# Patient Record
Sex: Female | Born: 1970 | Race: Black or African American | Hispanic: No | Marital: Single | Smoking: Current every day smoker
Health system: Southern US, Community
[De-identification: ages and names within clinical notes are randomized; demographics above are authoritative.]

## PROBLEM LIST (undated history)

## (undated) DIAGNOSIS — I10 Essential (primary) hypertension: Secondary | ICD-10-CM

## (undated) HISTORY — PX: FOOT SURGERY: SHX648

---

## 2011-03-26 ENCOUNTER — Emergency Department (HOSPITAL_COMMUNITY)
Admission: EM | Admit: 2011-03-26 | Discharge: 2011-03-26 | Disposition: A | Discharge status: Home / Self Care | Payer: BC Managed Care – PPO | Attending: Emergency Medicine | Admitting: Emergency Medicine

## 2011-03-26 DIAGNOSIS — M79672 Pain in left foot: Secondary | ICD-10-CM

## 2011-03-26 DIAGNOSIS — M79609 Pain in unspecified limb: Secondary | ICD-10-CM | POA: Insufficient documentation

## 2011-03-26 NOTE — ED Notes (Signed)
Lt. Foot and heel pain, began 1 week ago difficulty walking, denies any injury,

## 2011-03-26 NOTE — ED Provider Notes (Signed)
History    patient presented to the ED with chief complaints of left foot and heel pain. Patient states pain started about one week and half ago. She woke up one morning and noticed that she was having tenderness with weightbearing to the left foot. Increasing pain with foot flexion. Pain is described as throbbing and sharp and shooting. Denies numbness or weakness. Denies ankle pain or knee pain. She denies recent trauma, bleeding, or rash. Patient states she had L achilles tendon rupture at the age of 83 and required surgical intervention. She denies any specific complications until recently.  She denies changing her shoes, history of gout, history of plantar fasciitis. She is not a diabetic.  CSN: [id number] Arrival date & time: 03/26/2011  7:53 AM   First MD Initiated Contact with Patient 03/26/11 [phone]      Chief Complaint  Patient presents with  . Foot Pain    lt.  foot and heel, pt. cut her achilles tendon when she was 16, and was replaced, the pain to this area began 1 week ago. denies any injuires    (Consider location/radiation/quality/duration/timing/severity/associated sxs/prior treatment) HPI  History reviewed. No pertinent past medical history.  Past Surgical History  Procedure Date  . Foot surgery     lt. achilles tendon repair    History reviewed. No pertinent family history.  History  Substance Use Topics  . Smoking status: Former Games developer  . Smokeless tobacco: Never Used  . Alcohol Use: Yes    OB History    Grav Para Term Preterm Abortions TAB SAB Ect Mult Living                  Review of Systems  All other systems reviewed and are negative.    Allergies  Review of patient's allergies indicates no known allergies.  Home Medications  No current outpatient prescriptions on file.  BP 132/91  Pulse 78  Temp(Src) 98.4 F (36.9 C) (Oral)  Resp 16  Ht 5\' 5"  (1.651 m)  Wt 202 lb (91.627 kg)  BMI 33.61 kg/m2  SpO2 100%  LMP 03/16/2011  Physical  Exam  Constitutional: She is oriented to person, place, and time. She appears well-developed and well-nourished. No distress.  HENT:  Head: Normocephalic and atraumatic.  Eyes: Conjunctivae are normal.  Neck: Normal range of motion. Neck supple.  Cardiovascular: Normal rate and regular rhythm.  Exam reveals no gallop and no friction rub.   No murmur heard. Pulmonary/Chest: Effort normal. No respiratory distress. She has no wheezes.  Abdominal: Soft. Bowel sounds are normal. There is no tenderness.  Musculoskeletal: Normal range of motion.       Feet:  Neurological: She is alert and oriented to person, place, and time.    ED Course  Procedures (including critical care time)  Labs Reviewed - No data to display No results found.   No diagnosis found.    MDM  Patient has acute onset of left foot pain near site of prior Achilles tendon injury when she was 16.  It is questionable if this is an acute tendon injury. MRI would be the best modality, however that'll be best done outpatient after a reevaluation by an orthopedic Doctor.  No deformity noted, doubt bony pathology. No foot x-ray obtained today. A supportive boot and crutches were given. Patient is [redacted] weeks pregnant, therefore I suggest taking Tylenol only for pain. I recommend no NSAIDs, patient voiced understanding.        Fayrene Helper, PA 03/26/11  1054 

## 2011-03-26 NOTE — ED Provider Notes (Signed)
Medical screening examination/treatment/procedure(s) were performed by non-physician practitioner and as supervising physician I was immediately available for consultation/collaboration.   Kamal Jurgens A. Patrica Duel, MD 03/26/11 1131

## 2013-11-15 ENCOUNTER — Encounter (HOSPITAL_COMMUNITY): Payer: Self-pay | Admitting: Emergency Medicine

## 2013-11-15 ENCOUNTER — Emergency Department (INDEPENDENT_AMBULATORY_CARE_PROVIDER_SITE_OTHER)
Admission: EM | Admit: 2013-11-15 | Discharge: 2013-11-15 | Disposition: A | Discharge status: Home / Self Care | Payer: PRIVATE HEALTH INSURANCE | Source: Home / Self Care

## 2013-11-15 DIAGNOSIS — T148XXA Other injury of unspecified body region, initial encounter: Secondary | ICD-10-CM

## 2013-11-15 DIAGNOSIS — M7660 Achilles tendinitis, unspecified leg: Secondary | ICD-10-CM

## 2013-11-15 DIAGNOSIS — S39012A Strain of muscle, fascia and tendon of lower back, initial encounter: Secondary | ICD-10-CM

## 2013-11-15 DIAGNOSIS — S335XXA Sprain of ligaments of lumbar spine, initial encounter: Secondary | ICD-10-CM

## 2013-11-15 DIAGNOSIS — M7662 Achilles tendinitis, left leg: Secondary | ICD-10-CM

## 2013-11-15 DIAGNOSIS — X58XXXA Exposure to other specified factors, initial encounter: Secondary | ICD-10-CM

## 2013-11-15 MED ORDER — DICLOFENAC SODIUM 1 % TD GEL: 1.0000 "application " | Freq: Four times a day (QID) | TRANSDERMAL | Status: DC

## 2013-11-15 NOTE — ED Provider Notes (Signed)
Medical screening examination/treatment/procedure(s) were performed by resident physician or non-physician practitioner and as supervising physician I was immediately available for consultation/collaboration.  Erin Honig, MD     Erin J Honig, MD 11/15/13 1555 

## 2013-11-15 NOTE — ED Notes (Signed)
C/o 2 day duration of pain in low back and ankle which she attributes to working 2 double shifts 3 days ago . States she is better w OTC medications , rest , elevation of her legs. " I cant take it any more" Denies other injury

## 2013-11-15 NOTE — ED Provider Notes (Signed)
CSN: [id number]     Arrival date & time 11/15/13  1130 History   First MD Initiated Contact with Patient 11/15/13 1230     Chief Complaint  Patient presents with  . 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 She has been working double she has a long hours in the past couple weeks. Approximately 3 days ago she developed pain in the left ankle and the next day he developed pain across her lower back. Denies any known single of any injury. The pain was insidious but worsened quickly. The pain the back is exacerbated by movement such as bending or twisting of the torso. For left ankle pain is located over the left Achilles tendon. She is has a history of surgery on the tendon. The ankle bones and soft tissues are nontender and without swelling. No pain, swelling or tenderness to the foot.    History reviewed. No pertinent past medical history. Past Surgical History  Procedure Laterality Date  . Foot surgery      lt. achilles tendon repair   History reviewed. No pertinent family history. History  Substance Use Topics  . Smoking status: Former Games developermoker  . Smokeless tobacco: Never Used  . Alcohol Use: Yes   OB History   Grav Para Term Preterm Abortions TAB SAB Ect Mult Living                 Review of Systems  Constitutional: Positive for activity change. Negative for fever, chills and fatigue.  HENT: Negative.   Respiratory: Negative.   Cardiovascular: Negative.   Musculoskeletal:       As per HPI  Skin: Negative for color change, pallor and rash.  Neurological: Negative.        No focal paresthesias or weakness. No radicular pain.    Allergies  Review of patient's allergies indicates no known allergies.  Home Medications   Prior to Admission medications   Medication Sig Start Date End Date Taking? Authorizing Provider  diclofenac sodium (VOLTAREN) 1 % GEL Apply 1  application topically 4 (four) times daily. 11/15/13   Hayden Rasmussenavid Lotus Gover, NP   BP 149/104  Pulse 102  Temp(Src) 98.5 F (36.9 C)  Resp 12  SpO2 96% Physical Exam  Nursing note and vitals reviewed. Constitutional: She is oriented to person, place, and time. She appears well-developed and well-nourished. No distress.  HENT:  Head: Normocephalic and atraumatic.  Eyes: EOM are normal. Pupils are equal, round, and reactive to light.  Neck: Normal range of motion. Neck supple.  Musculoskeletal:  Tenderness across the para lumbar muscles. No spinal tenderness or deformity. No local swelling.  Neurological: She is alert and oriented to person, place, and time. No cranial nerve deficit.  Skin: Skin is warm and dry.    ED Course  Procedures (including critical care time) Labs Review Labs Reviewed - No data to display  Imaging Review No results found.   MDM   1. Low back strain, initial encounter   2. Muscle strain   3. Achilles tendinitis of left lower extremity    Diclofenac gel to back Heat to back Stretches as demo'd ASO L ankle Ice to ankle      Hayden Rasmussenavid Kamiryn Bezanson, NP 11/15/13 1251

## 2013-11-15 NOTE — Discharge Instructions (Signed)
Achilles Tendinitis Achilles tendinitis is inflammation of the tough, cord-like band that attaches the lower muscles of your leg to your heel (Achilles tendon). It is usually caused by overusing the tendon and joint involved.  CAUSES Achilles tendinitis can happen because of:  A sudden increase in exercise or activity (such as running).  Doing the same exercises or activities (such as jumping) over and over.  Not warming up calf muscles before exercising.  Exercising in shoes that are worn out or not made for exercise.  Having arthritis or a bone growth on the back of the heel bone. This can rub against the tendon and hurt the tendon. SIGNS AND SYMPTOMS The most common symptoms are:  Pain in the back of the leg, just above the heel. The pain usually gets worse with exercise and better with rest.  Stiffness or soreness in the back of the leg, especially in the morning.  Swelling of the skin over the Achilles tendon.  Trouble standing on tiptoe. Sometimes, an Achilles tendon tears (ruptures). Symptoms of an Achilles tendon rupture can include:  Sudden, severe pain in the back of the leg.  Trouble putting weight on the foot or walking normally. DIAGNOSIS Achilles tendinitis will be diagnosed based on symptoms and a physical examination. An X-ray may be done to check if another condition is causing your symptoms. An MRI may be ordered if your health care provider suspects you may have completely torn your tendon, which is called an Achilles tendon rupture.  TREATMENT  Achilles tendinitis usually gets better over time. It can take weeks to months to heal completely. Treatment focuses on treating the symptoms and helping the injury heal. HOME CARE INSTRUCTIONS   Rest your Achilles tendon and avoid activities that cause pain.  Apply ice to the injured area:  Put ice in a plastic bag.  Place a towel between your skin and the bag.  Leave the ice on for 20 minutes, 2-3 times a  day  Try to avoid using the tendon (other than gentle range of motion) while the tendon is painful. Do not resume use until instructed by your health care provider. Then begin use gradually. Do not increase use to the point of pain. If pain does develop, decrease use and continue the above measures. Gradually increase activities that do not cause discomfort until you achieve normal use.  Do exercises to make your calf muscles stronger and more flexible. Your health care provider or physical therapist can recommend exercises for you to do.  Wrap your ankle with an elastic bandage or other wrap. This can help keep your tendon from moving too much. Your health care provider will show you how to wrap your ankle correctly.  Only take over-the-counter or prescription medicines for pain, discomfort, or fever as directed by your health care provider. SEEK MEDICAL CARE IF:   Your pain and swelling increase or pain is uncontrolled with medicines.  You develop new, unexplained symptoms or your symptoms get worse.  You are unable to move your toes or foot.  You develop warmth and swelling in your foot.  You have an unexplained temperature. MAKE SURE YOU:   Understand these instructions.  Will watch your condition.  Will get help right away if you are not doing well or get worse. Document Released: 01/23/2005 Document Revised: 02/03/2013 Document Reviewed: 11/25/2012 [facility] Patient Information 2015 [place], [place]. This information is not intended to replace advice given to you by your health care provider. Make sure you discuss  any questions you have with your health care provider.  Back Pain, Adult Low back pain is very common. About 1 in 5 people have back pain.The cause of low back pain is rarely dangerous. The pain often gets better over time.About half of people with a sudden onset of back pain feel better in just 2 weeks. About 8 in 10 people feel better by 6 weeks.  CAUSES Some common  causes of back pain include:  Strain of the muscles or ligaments supporting the spine.  Wear and tear (degeneration) of the spinal discs.  Arthritis.  Direct injury to the back. DIAGNOSIS Most of the time, the direct cause of low back pain is not known.However, back pain can be treated effectively even when the exact cause of the pain is unknown.Answering your caregiver's questions about your overall health and symptoms is one of the most accurate ways to make sure the cause of your pain is not dangerous. If your caregiver needs more information, he or she may order lab work or imaging tests (X-rays or MRIs).However, even if imaging tests show changes in your back, this usually does not require surgery. HOME CARE INSTRUCTIONS For many people, back pain returns.Since low back pain is rarely dangerous, it is often a condition that people can learn to [facility] their own.   Remain active. It is stressful on the back to sit or stand in one place. Do not sit, drive, or stand in one place for more than 30 minutes at a time. Take short walks on level surfaces as soon as pain allows.Try to increase the length of time you walk each day.  Do not stay in bed.Resting more than 1 or 2 days can delay your recovery.  Do not avoid exercise or work.Your body is made to move.It is not dangerous to be active, even though your back may hurt.Your back will likely heal faster if you return to being active before your pain is gone.  Pay attention to your body when you bend and lift. Many people have less discomfortwhen lifting if they bend their knees, keep the load close to their bodies,and avoid twisting. Often, the most comfortable positions are those that put less stress on your recovering back.  Find a comfortable position to sleep. Use a firm mattress and lie on your side with your knees slightly bent. If you lie on your back, put a pillow under your knees.  Only take over-the-counter or  prescription medicines as directed by your caregiver. Over-the-counter medicines to reduce pain and inflammation are often the most helpful.Your caregiver may prescribe muscle relaxant drugs.These medicines help dull your pain so you can more quickly return to your normal activities and healthy exercise.  Put ice on the injured area.  Put ice in a plastic bag.  Place a towel between your skin and the bag.  Leave the ice on for 15-20 minutes, 03-04 times a day for the first 2 to 3 days. After that, ice and heat may be alternated to reduce pain and spasms.  Ask your caregiver about trying back exercises and gentle massage. This may be of some benefit.  Avoid feeling anxious or stressed.Stress increases muscle tension and can worsen back pain.It is important to recognize when you are anxious or stressed and learn ways to manage it.Exercise is a great option. SEEK MEDICAL CARE IF:  You have pain that is not relieved with rest or medicine.  You have pain that does not improve in 1 week.  You  have new symptoms.  You are generally not feeling well. SEEK IMMEDIATE MEDICAL CARE IF:   You have pain that radiates from your back into your legs.  You develop new bowel or bladder control problems.  You have unusual weakness or numbness in your arms or legs.  You develop nausea or vomiting.  You develop abdominal pain.  You feel faint. Document Released: 04/15/2005 Document Revised: 10/15/2011 Document Reviewed: 09/03/2010 [facility] Patient Information 2015 [place], [place]. This information is not intended to replace advice given to you by your health care provider. Make sure you discuss any questions you have with your health care provider.  Lumbosacral Strain Lumbosacral strain is a strain of any of the parts that make up your lumbosacral vertebrae. Your lumbosacral vertebrae are the bones that make up the lower third of your backbone. Your lumbosacral vertebrae are held together by  muscles and tough, fibrous tissue (ligaments).  CAUSES  A sudden blow to your back can cause lumbosacral strain. Also, anything that causes an excessive stretch of the muscles in the low back can cause this strain. This is typically seen when people exert themselves strenuously, fall, lift heavy objects, bend, or crouch repeatedly. RISK FACTORS  Physically demanding work.  Participation in pushing or pulling sports or sports that require a sudden twist of the back (tennis, golf, baseball).  Weight lifting.  Excessive lower back curvature.  Forward-tilted pelvis.  Weak back or abdominal muscles or both.  Tight hamstrings. SIGNS AND SYMPTOMS  Lumbosacral strain may cause pain in the area of your injury or pain that moves (radiates) down your leg.  DIAGNOSIS Your health care provider can often diagnose lumbosacral strain through a physical exam. In some cases, you may need tests such as X-ray exams.  TREATMENT  Treatment for your lower back injury depends on many factors that your clinician will have to evaluate. However, most treatment will include the use of anti-inflammatory medicines. HOME CARE INSTRUCTIONS   Avoid hard physical activities (tennis, racquetball, waterskiing) if you are not in proper physical condition for it. This may aggravate or create problems.  If you have a back problem, avoid sports requiring sudden body movements. Swimming and walking are generally safer activities.  Maintain good posture.  Maintain a healthy weight.  For acute conditions, you may put ice on the injured area.  Put ice in a plastic bag.  Place a towel between your skin and the bag.  Leave the ice on for 20 minutes, 2-3 times a day.  When the low back starts healing, stretching and strengthening exercises may be recommended. SEEK MEDICAL CARE IF:  Your back pain is getting worse.  You experience severe back pain not relieved with medicines. SEEK IMMEDIATE MEDICAL CARE IF:   You  have numbness, tingling, weakness, or problems with the use of your arms or legs.  There is a change in bowel or bladder control.  You have increasing pain in any area of the body, including your belly (abdomen).  You notice shortness of breath, dizziness, or feel faint.  You feel sick to your stomach (nauseous), are throwing up (vomiting), or become sweaty.  You notice discoloration of your toes or legs, or your feet get very cold. MAKE SURE YOU:   Understand these instructions.  Will watch your condition.  Will get help right away if you are not doing well or get worse. Document Released: 01/23/2005 Document Revised: 04/20/2013 Document Reviewed: 12/02/2012 [facility] Patient Information 2015 [place], [place]. This information is not intended  to replace advice given to you by your health care provider. Make sure you discuss any questions you have with your health care provider.  Muscle Strain A muscle strain (pulled muscle) happens when a muscle is stretched beyond normal length. It happens when a sudden, violent force stretches your muscle too far. Usually, a few of the fibers in your muscle are torn. Muscle strain is common in athletes. Recovery usually takes 1-2 weeks. Complete healing takes 5-6 weeks.  HOME CARE   Follow the PRICE method of treatment to help your injury get better. Do this the first 2-3 days after the injury:  Protect. Protect the muscle to keep it from getting injured again.  Rest. Limit your activity and rest the injured body part.  Ice. Put ice in a plastic bag. Place a towel between your skin and the bag. Then, apply the ice and leave it on from 15-20 minutes each hour. After the third day, switch to moist heat packs.  Compression. Use a splint or elastic bandage on the injured area for comfort. Do not put it on too tightly.  Elevate. Keep the injured body part above the level of your heart.  Only take medicine as told by your doctor.  Warm up before  doing exercise to prevent future muscle strains. GET HELP IF:   You have more pain or puffiness (swelling) in the injured area.  You feel numbness, tingling, or notice a loss of strength in the injured area. MAKE SURE YOU:   Understand these instructions.  Will watch your condition.  Will get help right away if you are not doing well or get worse. Document Released: 01/23/2008 Document Revised: 02/03/2013 Document Reviewed: 11/12/2012 [facility] Patient Information 2015 [place]. This information is not intended to replace advice given to you by your health care provider. Make sure you discuss any questions you have with your health care provider.

## 2015-03-03 ENCOUNTER — Emergency Department (HOSPITAL_COMMUNITY)
Admission: EM | Admit: 2015-03-03 | Discharge: 2015-03-03 | Disposition: A | Discharge status: Home / Self Care | Payer: Self-pay | Attending: Emergency Medicine | Admitting: Emergency Medicine

## 2015-03-03 ENCOUNTER — Emergency Department (HOSPITAL_COMMUNITY): Payer: Self-pay

## 2015-03-03 ENCOUNTER — Encounter (HOSPITAL_COMMUNITY): Payer: Self-pay

## 2015-03-03 DIAGNOSIS — Z720 Tobacco use: Secondary | ICD-10-CM | POA: Insufficient documentation

## 2015-03-03 DIAGNOSIS — I10 Essential (primary) hypertension: Secondary | ICD-10-CM | POA: Insufficient documentation

## 2015-03-03 DIAGNOSIS — R112 Nausea with vomiting, unspecified: Secondary | ICD-10-CM | POA: Insufficient documentation

## 2015-03-03 DIAGNOSIS — R05 Cough: Secondary | ICD-10-CM | POA: Insufficient documentation

## 2015-03-03 DIAGNOSIS — R197 Diarrhea, unspecified: Secondary | ICD-10-CM | POA: Insufficient documentation

## 2015-03-03 DIAGNOSIS — Z7982 Long term (current) use of aspirin: Secondary | ICD-10-CM | POA: Insufficient documentation

## 2015-03-03 DIAGNOSIS — R52 Pain, unspecified: Secondary | ICD-10-CM | POA: Insufficient documentation

## 2015-03-03 DIAGNOSIS — R059 Cough, unspecified: Secondary | ICD-10-CM

## 2015-03-03 HISTORY — DX: Essential (primary) hypertension: I10

## 2015-03-03 LAB — I-STAT CHEM 8, ED
BUN: 4 mg/dL — AB (ref 6–20)
CREATININE: 0.8 mg/dL (ref 0.44–1.00)
Calcium, Ion: 1.09 mmol/L — ABNORMAL LOW (ref 1.12–1.23)
Chloride: 101 mmol/L (ref 101–111)
Glucose, Bld: 102 mg/dL — ABNORMAL HIGH (ref 65–99)
HEMATOCRIT: 45 % (ref 36.0–46.0)
Hemoglobin: 15.3 g/dL — ABNORMAL HIGH (ref 12.0–15.0)
POTASSIUM: 3.6 mmol/L (ref 3.5–5.1)
Sodium: 137 mmol/L (ref 135–145)
TCO2: 20 mmol/L (ref 0–100)

## 2015-03-03 LAB — I-STAT TROPONIN, ED: Troponin i, poc: 0 ng/mL (ref 0.00–0.08)

## 2015-03-03 MED ORDER — ACETAMINOPHEN 325 MG PO TABS
650.0000 mg | ORAL_TABLET | Freq: Once | ORAL | Status: AC
  Administered 2015-03-03: 650 mg via ORAL
  Filled 2015-03-03: qty 2

## 2015-03-03 MED ORDER — IBUPROFEN 400 MG PO TABS
600.0000 mg | ORAL_TABLET | Freq: Once | ORAL | Status: AC
  Administered 2015-03-03: 600 mg via ORAL
  Filled 2015-03-03 (×2): qty 1

## 2015-03-03 NOTE — ED Provider Notes (Signed)
CSN: [id number]     Arrival date & time 03/03/15  0940 History   First MD Initiated Contact with Patient 03/03/15 [phone]     Chief Complaint  Patient presents with  . Chest Pain     (Consider location/radiation/quality/duration/timing/severity/associated sxs/prior Treatment) HPI Comments: 44 year old female with no significant medical history, cigarettes smoker presents with cough, vomiting, body aches and generally feeling unwell for the past few days. No recent travel. No significant sick contacts however patient does work in a nursing home. Patient has mild bilateral chest discomfort only with vomiting and coughing. No cardiac history. No recent surgeries  Patient is a 44 y.o. female presenting with chest pain. The history is provided by the patient.  Chest Pain Associated symptoms: headache, nausea and vomiting   Associated symptoms: no abdominal pain, no back pain, no fever and no shortness of breath     Past Medical History  Diagnosis Date  . Hypertension    Past Surgical History  Procedure Laterality Date  . Foot surgery      lt. achilles tendon repair   No family history on file. Social History  Substance Use Topics  . Smoking status: Current Every Day Smoker  . Smokeless tobacco: Never Used  . Alcohol Use: Yes   OB History    No data available     Review of Systems  Constitutional: Positive for chills and appetite change. Negative for fever.  HENT: Negative for congestion.   Eyes: Negative for visual disturbance.  Respiratory: Negative for shortness of breath.   Cardiovascular: Positive for chest pain.  Gastrointestinal: Positive for nausea, vomiting and diarrhea. Negative for abdominal pain.  Genitourinary: Negative for dysuria and flank pain.  Musculoskeletal: Negative for back pain, neck pain and neck stiffness.  Skin: Negative for rash.  Neurological: Positive for headaches. Negative for light-headedness.      Allergies  Review of patient's allergies  indicates no known allergies.  Home Medications   Prior to Admission medications   Medication Sig Start Date End Date Taking? Authorizing Provider  acetaminophen (TYLENOL) 325 MG tablet Take 650 mg by mouth every 6 (six) hours as needed for mild pain.   Yes Historical Provider, MD  Aspirin-Acetaminophen-Caffeine (GOODYS EXTRA STRENGTH) 520-260-32.5 MG PACK Take 1 packet by mouth daily as needed. For pain   Yes Historical Provider, MD  guaiFENesin (MUCINEX) 600 MG 12 hr tablet Take 600 mg by mouth 2 (two) times daily as needed for to loosen phlegm.   Yes Historical Provider, MD  diclofenac sodium (VOLTAREN) 1 % GEL Apply 1 application topically 4 (four) times daily. Patient not taking: Reported on 03/03/2015 11/15/13   Hayden Rasmussen, NP   BP 134/92 mmHg  Pulse 103  Temp(Src) 98.7 F (37.1 C) (Oral)  Resp 28  Ht  (1.651 m)  Wt 190 lb (86.183 kg)  BMI 31.62 kg/m2  SpO2 97%  LMP 02/07/2015 Physical Exam  Constitutional: She is oriented to person, place, and time. She appears well-developed and well-nourished.  HENT:  Head: Normocephalic and atraumatic.  Eyes: Conjunctivae are normal. Right eye exhibits no discharge. Left eye exhibits no discharge.  Neck: Normal range of motion. Neck supple. No tracheal deviation present.  Cardiovascular: Regular rhythm.   Pulmonary/Chest: Effort normal and breath sounds normal.  Abdominal: Soft. She exhibits no distension. There is no tenderness. There is no guarding.  Musculoskeletal: She exhibits no edema.  Neurological: She is alert and oriented to person, place, and time.  Skin: Skin is warm. No rash  noted.  Psychiatric: She has a normal mood and affect.  Nursing note and vitals reviewed.   ED Course  Procedures (including critical care time) Labs Review Labs Reviewed  I-STAT CHEM 8, ED - Abnormal; Notable for the following:    BUN 4 (*)    Glucose, Bld 102 (*)    Calcium, Ion 1.09 (*)    Hemoglobin 15.3 (*)    All other components  within normal limits  I-STAT TROPOININ, ED    Imaging Review Dg Chest 2 View  03/03/2015  CLINICAL DATA:  Cough, congestion and low-grade fever. EXAM: CHEST - 2 VIEW COMPARISON:  None FINDINGS: The heart size and mediastinal contours are within normal limits. There is no evidence of pulmonary edema, consolidation, pneumothorax, nodule or pleural fluid. The visualized skeletal structures are unremarkable. IMPRESSION: No active disease. Electronically Signed   By: Irish LackGlenn  Yamagata M.D.   On: 03/03/2015 10:35   I have personally reviewed and evaluated these images and lab results as part of my medical decision-making.   EKG Interpretation None     EKG reviewed heart rate 103, sinus, normal QT, no acute ST changes. MDM   Final diagnoses:  Cough  Body aches   Patient presents with viral-like symptoms. Chest x-ray reviewed no acute findings. Patient nontoxic appearing, tolerating oral and the ER. Patient requesting work note. Follow-up and supportive care discussed.  Results and differential diagnosis were discussed with the patient/parent/guardian. Xrays were independently reviewed by myself.  Close follow up outpatient was discussed, comfortable with the plan.   Medications  acetaminophen (TYLENOL) tablet 650 mg (650 mg Oral Given 03/03/15 1018)  ibuprofen (ADVIL,MOTRIN) tablet 600 mg (600 mg Oral Given 03/03/15 1018)    Filed Vitals:   03/03/15 0959 03/03/15 1015 03/03/15 1052  BP: 134/92 129/89 107/80  Pulse: 103 102 95  Temp: 98.7 F (37.1 C)    TempSrc: Oral    Resp: 28 24   Height: 5\' 5"  (1.651 m)    Weight: 190 lb (86.183 kg)    SpO2: 97% 99% 99%    Final diagnoses:  Cough  Body aches        Blane OharaJoshua Aileena Iglesia, MD 03/03/15 1056

## 2015-03-03 NOTE — Discharge Instructions (Signed)
If you were given medicines take as directed.  If you are on coumadin or contraceptives realize their levels and effectiveness is altered by many different medicines.  If you have any reaction (rash, tongues swelling, other) to the medicines stop taking and see a physician.    If your blood pressure was elevated in the ER make sure you follow up for management with a primary doctor or return for chest pain, shortness of breath or stroke symptoms.  Please follow up as directed and return to the ER or see a physician for new or worsening symptoms.  Thank you. Filed Vitals:   03/03/15 0959  BP: 134/92  Pulse: 103  Temp: 98.7 F (37.1 C)  TempSrc: Oral  Resp: 28  Height: 5\' 5"  (1.651 m)  Weight: 190 lb (86.183 kg)  SpO2: 97%

## 2015-03-03 NOTE — ED Notes (Signed)
Pt. Developed cough and congestion with chest pain.  Body aches n/v/d . Pt. Works at [employer] and feels she may have developed the symptoms from there

## 2015-06-07 ENCOUNTER — Emergency Department (HOSPITAL_COMMUNITY)
Admission: EM | Admit: 2015-06-07 | Discharge: 2015-06-07 | Disposition: A | Discharge status: Home / Self Care | Payer: Self-pay | Attending: Emergency Medicine | Admitting: Emergency Medicine

## 2015-06-07 ENCOUNTER — Encounter (HOSPITAL_COMMUNITY): Payer: Self-pay | Admitting: Family Medicine

## 2015-06-07 DIAGNOSIS — I10 Essential (primary) hypertension: Secondary | ICD-10-CM | POA: Insufficient documentation

## 2015-06-07 DIAGNOSIS — F17200 Nicotine dependence, unspecified, uncomplicated: Secondary | ICD-10-CM | POA: Insufficient documentation

## 2015-06-07 NOTE — ED Provider Notes (Signed)
CSN: [id number]     Arrival date & time 06/07/15  0803 History   First MD Initiated Contact with Patient 06/07/15 [phone]     Chief Complaint  Patient presents with  . Hypertension     (Consider location/radiation/quality/duration/timing/severity/associated sxs/prior Treatment) HPI  Blood pressure 140/104, pulse 84, temperature 98.3 F (36.8 C), temperature source Oral, resp. rate 18, SpO2 100 %.  Debra Wallace is a 45 y.o. female  with a history of hypertension presents for evaluation of an episode of dizziness and high blood pressure. 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 She thinks she "smoked her cigarette too fast" because when she went inside she felt dizzy and had to sit down and drink a glass of water. Pt also did not eat anything for breakfast this AM. The nurse at her work became concerned and checked her blood pressure at that time and it was 142/102, so she came here to the ED for further evaluation. Currently denies any complaint of dizziness/lightheadedness, vision changes, headache, chest pain, shortness of breath, cough, fevers. She was diagnosed with hypertension 15-20 years ago and was previously on valsartan. Due to issues with insurance, pt was unable to continue taking this and has not had a PCP in years, though she states her insurance with her new job starts next week on 06/14/15.  Past Medical History  Diagnosis Date  . Hypertension    Past Surgical History  Procedure Laterality Date  . Foot surgery      lt. achilles tendon repair   History reviewed. No pertinent family history. Social History  Substance Use Topics  . Smoking status: Current Every Day Smoker  . Smokeless tobacco: Never Used  . Alcohol Use: Yes   OB History    No data available     Review of Systems  10 systems reviewed and found to be negative, except as noted in the HPI.   Allergies  Review of patient's allergies indicates no known allergies.  Home Medications    Prior to Admission medications   Medication Sig Start Date End Date Taking? Authorizing Provider  diclofenac sodium (VOLTAREN) 1 % GEL Apply 1 application topically 4 (four) times daily. Patient not taking: Reported on 03/03/2015 11/15/13   Hayden Rasmussen, NP   BP 135/105 mmHg  Pulse 94  Temp(Src) 98.1 F (36.7 C) (Oral)  Resp 20  SpO2 98% Physical Exam  Constitutional: She is oriented to person, place, and time. She appears well-developed and well-nourished. No distress.  HENT:  Head: Normocephalic and atraumatic.  Mouth/Throat: Oropharynx is clear and moist.  Eyes: Conjunctivae and EOM are normal. Pupils are equal, round, and reactive to light.  Neck: Normal range of motion.  Cardiovascular: Normal rate, regular rhythm and intact distal pulses.   Pulmonary/Chest: Effort normal and breath sounds normal.  Abdominal: Soft. There is no tenderness.  Musculoskeletal: Normal range of motion. She exhibits no edema or tenderness.  Neurological: She is alert and oriented to person, place, and time.  Follows commands, Clear, goal oriented speech, Strength is 5 out of 5x4 extremities, patient ambulates with a coordinated in nonantalgic gait. Sensation is grossly intact.   Skin: She is not diaphoretic.  Psychiatric: She has a normal mood and affect.  Nursing note and vitals reviewed.   ED Course  Procedures (including critical care time) Labs Review Labs Reviewed - No data to display  Imaging Review No results found. I have personally reviewed and evaluated these images and lab results  as part of my medical decision-making.   EKG Interpretation   Date/Time:  Wednesday June 07 2015 09:50:30 EST Ventricular Rate:  86 PR Interval:  166 QRS Duration: 97 QT Interval:  380 QTC Calculation: 454 R Axis:   67 Text Interpretation:  Sinus rhythm RSR' in V1 or V2, right VCD or RVH  Confirmed by Lincoln Brigham [phone]) on 06/07/2015 10:27:06 AM      MDM   Final diagnoses:  Hypertension,  uncontrolled  Tobacco use disorder    Filed Vitals:   06/07/15 0805 06/07/15 0830 06/07/15 0915 06/07/15 0952  BP: 164/113 135/105 134/102 140/104  Pulse: 113 94 93 84  Temp: 98.1 F (36.7 C)   98.3 F (36.8 C)  TempSrc: Oral   Oral  Resp: SpO2: 100% 98% 97% 100%     Debra Wallace is 45 y.o. female presenting for evaluation of elevated blood pressure and episode of lightheadedness, EKG with no acute changes. Patient is asymptomatic at this point. Luckily, this patient's insurance comes into effect next week. Patient is given outpatient referral guide so she can establish primary care and they can manage her chronic hypertension. I've counseled this patient on smoking cessation for right within 10 minutes.  Evaluation does not show pathology that would require ongoing emergent intervention or inpatient treatment. Pt is hemodynamically stable and mentating appropriately. Discussed findings and plan with patient/guardian, who agrees with care plan. All questions answered. Return precautions discussed and outpatient follow up given.       Wynetta Emery, PA-C 06/07/15 1044  Tilden Fossa, MD 06/08/15 [phone]

## 2015-06-07 NOTE — ED Notes (Signed)
Pt here for evaluation of hypertension. sts she was at work and stood up to fast and felt a little dizzy. sts she feels fine now. No complaints.

## 2015-06-07 NOTE — Discharge Instructions (Signed)
Please follow with your primary care doctor in the next 5 days for high blood pressure evaluation. If you do not have a primary care doctor, present to urgent care. Reduce salt intake. Seek emergency medical care for unilateral weakness, slurring, change in vision, or chest pain and shortness of breath.  Do not hesitate to return to the emergency room for any new, worsening or concerning symptoms.  Please obtain primary care using resource guide below. Let them know that you were seen in the emergency room and that they will need to obtain records for further outpatient management.  Hypertension Hypertension is another name for high blood pressure. High blood pressure forces your heart to work harder to pump blood. A blood pressure reading has two numbers, which includes a higher number over a lower number (example: 110/72). HOME CARE   Have your blood pressure rechecked by your doctor.  Only take medicine as told by your doctor. Follow the directions carefully. The medicine does not work as well if you skip doses. Skipping doses also puts you at risk for problems.  Do not smoke.  Monitor your blood pressure at home as told by your doctor. GET HELP IF:  You think you are having a reaction to the medicine you are taking.  You have repeat headaches or feel dizzy.  You have puffiness (swelling) in your ankles.  You have trouble with your vision. GET HELP RIGHT AWAY IF:   You get a very bad headache and are confused.  You feel weak, numb, or faint.  You get chest or belly (abdominal) pain.  You throw up (vomit).  You cannot breathe very well. MAKE SURE YOU:   Understand these instructions.  Will watch your condition.  Will get help right away if you are not doing well or get worse.   This information is not intended to replace advice given to you by your health care provider. Make sure you discuss any questions you have with your health care provider.   Document Released:  10/02/2007 Document Revised: 04/20/2013 Document Reviewed: 02/05/2013 Elsevier Interactive Patient Education 2016 [employer].    Emergency Department Resource Guide 1) Find a Doctor and Pay Out of Pocket Although you won't have to find out who is covered by your insurance plan, it is a good idea to ask around and get recommendations. You will then need to call the office and see if the doctor you have chosen will accept you as a new patient and what types of options they offer for patients who are self-pay. Some doctors offer discounts or will set up payment plans for their patients who do not have insurance, but you will need to ask so you aren't surprised when you get to your appointment.  2) Contact Your Local Health Department Not all health departments have doctors that can see patients for sick visits, but many do, so it is worth a call to see if yours does. If you don't know where your local health department is, you can check in your phone book. The CDC also has a tool to help you locate your state's health department, and many state websites also have listings of all of their local health departments.  3) Find a Walk-in Clinic If your illness is not likely to be very severe or complicated, you may want to try a walk in clinic. These are popping up all over the country in pharmacies, drugstores, and shopping centers. They're usually staffed by nurse practitioners or physician assistants  that have been trained to treat common illnesses and complaints. They're usually fairly quick and inexpensive. However, if you have serious medical issues or chronic medical problems, these are probably not your best option.  No Primary Care Doctor: - Call Health Connect at  [phone] - they can help you locate a primary care doctor that  accepts your insurance, provides certain services, etc. - Physician Referral Service- [phone]  Chronic Pain Problems: Organization         Address  Phone    Notes  [facility]  [phone] Patients need to be referred by their primary care doctor.   Medication Assistance: Organization         Address  Phone   Notes  [facility] [address] [place] [place], [place] [address] [phone] --Must be a resident of [facility] -- Must have NO insurance coverage whatsoever (no Medicaid/ Medicare, etc.) -- The pt. MUST have a primary care doctor that directs their care regularly and follows them in the community   [facility]  [phone]   [employer]  [phone]    Agencies that provide inexpensive medical care: Organization         Address  Phone   Notes  [facility]  [phone]   [facility]    [phone]   [facility] [address] [place], [place] [address] [phone]   [facility] [address] [place] [place] [phone]   Planned Parenthood    [phone]   [facility]    [phone]   [facility] and [facility]  [address], [place] Phone:  [phone], Fax:  [phone] Hours of Operation:  9 am - 6 pm, M-F.  Also accepts Medicaid/Medicare and self-pay.  [facility]  [address], [place] Phone: [phone], Fax: [phone]. Hours of Operation:  8:30 am - 5:30 pm, M-F.  Also accepts Medicaid and self-pay.  [facility] [place] [address] [place] Phone: [phone]   [facility] [address] Natasha Bence [place], [place] [phone], Ext. [phone] Mondays & Thursdays: 7-9 AM.  First 15 patients are seen on a first come, first serve basis.    Medicaid-accepting [facility] Providers:  Organization         Address  Phone   Notes  [facility] [address], [place] [phone] Also accepts self-pay patients.  [facility]  [address] Laurell Josephs [place], [place]  [phone]   [facility] [address], [place] [phone]   [facility] [address] [place] [phone]   Renaye Rakers [address], [place]   [phone] Only accepts [place] Access [place] patients after they have their name applied to their card.   Self-Pay (no insurance) in [facility]:  Organization         Address  Phone   Notes  Sickle Cell Patients, [facility] [address] [place], [place] [phone]   [facility] [address] [place], [place] [phone]   [facility] [place]  [address], [place] [phone]   [facility]/Dr. Osei-Bonsu  [address] [place] [address],  [facility] or [address], [place] [phone] Phone number for both [place] and [place] locations is the same.  Urgent Medical and [facility] [address] [place] [phone]   [facility] [address], [place] or [address] [phone]   [facility] [address] [place] [phone], phone; [phone], fax Sees patients 1st and 3rd Saturday of every month.  Must not qualify for public or private insurance (i.e. Medicaid, Medicare, [place] Health Choice, Veterans' Benefits)  Household income should be no more than 200% of the poverty level The clinic cannot treat you if you are pregnant or think you are pregnant  Sexually transmitted diseases are not treated at the clinic.    Dental Care: Organization         Address  Phone  Notes  [facility] Department of [facility] [address] [place], [place] [phone] Accepts children up to age 37 who are enrolled in [place] or [place] Health Choice; pregnant women with a Medicaid card; and children who have  applied for Medicaid or [place] Health Choice, but were declined, whose parents can pay a reduced fee at time of service.  [facility] Department of [facility]  [address], [place] [phone] Accepts children up to age 25 who are enrolled in [place] or [place] Health Choice; pregnant women with a Medicaid card; and children who have applied for Medicaid or [place] Health Choice, but were declined, whose parents can pay a reduced fee at time of service.  [facility]  [address] [place], [place] [phone] Patients are seen by appointment only. Walk-ins are not accepted. [facility] will see patients 70 years of age and older. Monday - Tuesday (8am-5pm) Most Wednesdays (8:30-5pm) $30 per visit, cash only  [facility] Adult Dental Access PROGRAM  [address], [facility] [phone] Patients are seen by appointment only. Walk-ins are not accepted. [facility] will see patients 15 years of age and older. One Wednesday Evening (Monthly: Volunteer Based).  $30 per visit, cash only  [employer]  [phone] for adults; Children under age 33, call Graduate Pediatric Dentistry at [phone]. Children aged 42-14, please call [phone] to request a pediatric application.  Dental services are provided in all areas of dental care including fillings, crowns and bridges, complete and partial dentures, implants, gum treatment, root canals, and extractions. Preventive care is also provided. Treatment is provided to both adults and children. Patients are selected via a lottery and there is often a waiting list.   [facility] [address] [place]  [phone] [email or web address]   [facility] [address] [place], [place] [phone], Ext. [phone] Second and Fourth Thursday of each month, opens at 6:30 AM; Clinic ends at 9 AM.  Patients are seen on a first-come first-served basis, and a  limited number are seen during each clinic.   [facility]  [address] Ether Griffins [place], [place] [phone]   Eligibility Requirements You must have lived in [place], [place], or [place] counties for at least the last three months.   You cannot be eligible for state or federal sponsored [place], including [employer], [place], or [employer].   You generally cannot be eligible for healthcare insurance through your employer.    How to apply:  Eligibility screenings are held every Tuesday and Wednesday afternoon from 1:00 pm until 4:00 pm. You do not need an appointment for the interview!  [facility] [address] [place], [place]   [place]  Homeland Department  [place]  [phone]    Behavioral Health Resources in the Community: Intensive Outpatient Programs Organization         Address  Phone  Notes  [facility] [place]. [address], [place], [place] [phone]   [facility] [address] [place], [place]   ADS: [facility] [address] [place], [place]   [place] [address]  [place], [place] or [phone]   Substance Abuse Resources Organization         Address  Phone  Notes  Al[facility] Drug Services  [phone]   [place]  [phone]   [facility] [place]   Chinita Pester  [phone]   Residential & Outpatient Substance Abuse Program  [phone]   Psychological Services Organization         Address  Phone  Notes  [facility] [place]  [place]  [phone]   [place] [address], [place] or [phone]    Mobile Crisis Teams Organization          Address  Phone  Notes  [facility], Mobile Crisis Care Unit  [phone]   [facility]  [address] [place], [place]   [place] Levels [address] [place] [phone]    Self-Help/Support Groups Organization         Address  Phone             Notes  [place]. of [place] - variety of support groups  [place] Call for more information  Narcotics Anonymous (NA), [facility] [address], [employer]  2 meetings at this location   Special educational needs teacher         Address  Phone  Notes  [facility] Residential Treatment [place],    [place]  [phone]   [facility]  [address] [place] [phone], [place], [place]   [place], [place] [phone] Admissions: 8am-3pm M-F  Incentives Substance [place] [address]    [place], [place] [phone]   [facility] [address] [place], [place], [place]   [facility] [address],  [place], [place]   [facility] - Intensive Outpatient [address], [place], [place]   [facility].,  [place], [place] [phone] or [phone]   [facility] (RTS) [address] [place], [place] Accepts Medicaid  [facility] [address]  [place] [phone] Substance Abuse/Addiction Treatment   [facility] Organization         Address  Phone  Notes  [facility]  [phone]   Domenic Schwab, PhD [address] Arlis Porta [place], [place]   [phone] or [phone]   [facility] [place], [place] [phone]   [place] [address] [place], [place] [phone] Insurance/Medicaid/sponsorship  through [facility] and Families [address], [address] [place], [place] [phone] [place], [place] [phone]    Dr. Adele Schilder  [phone]   [facility] Dept. 1) [address], [place] 2) [place] 3)  [place] [address], [place] [phone]  [phone]   [place] [phone] or [phone] (After Hours)

## 2015-12-08 IMAGING — DX DG CHEST 2V
2 series · 2 of 2 positions shown · non-contrast
Comparison: None

CLINICAL DATA: Cough, congestion and low-grade fever.

EXAM:
CHEST - 2 VIEW

[chest pa]
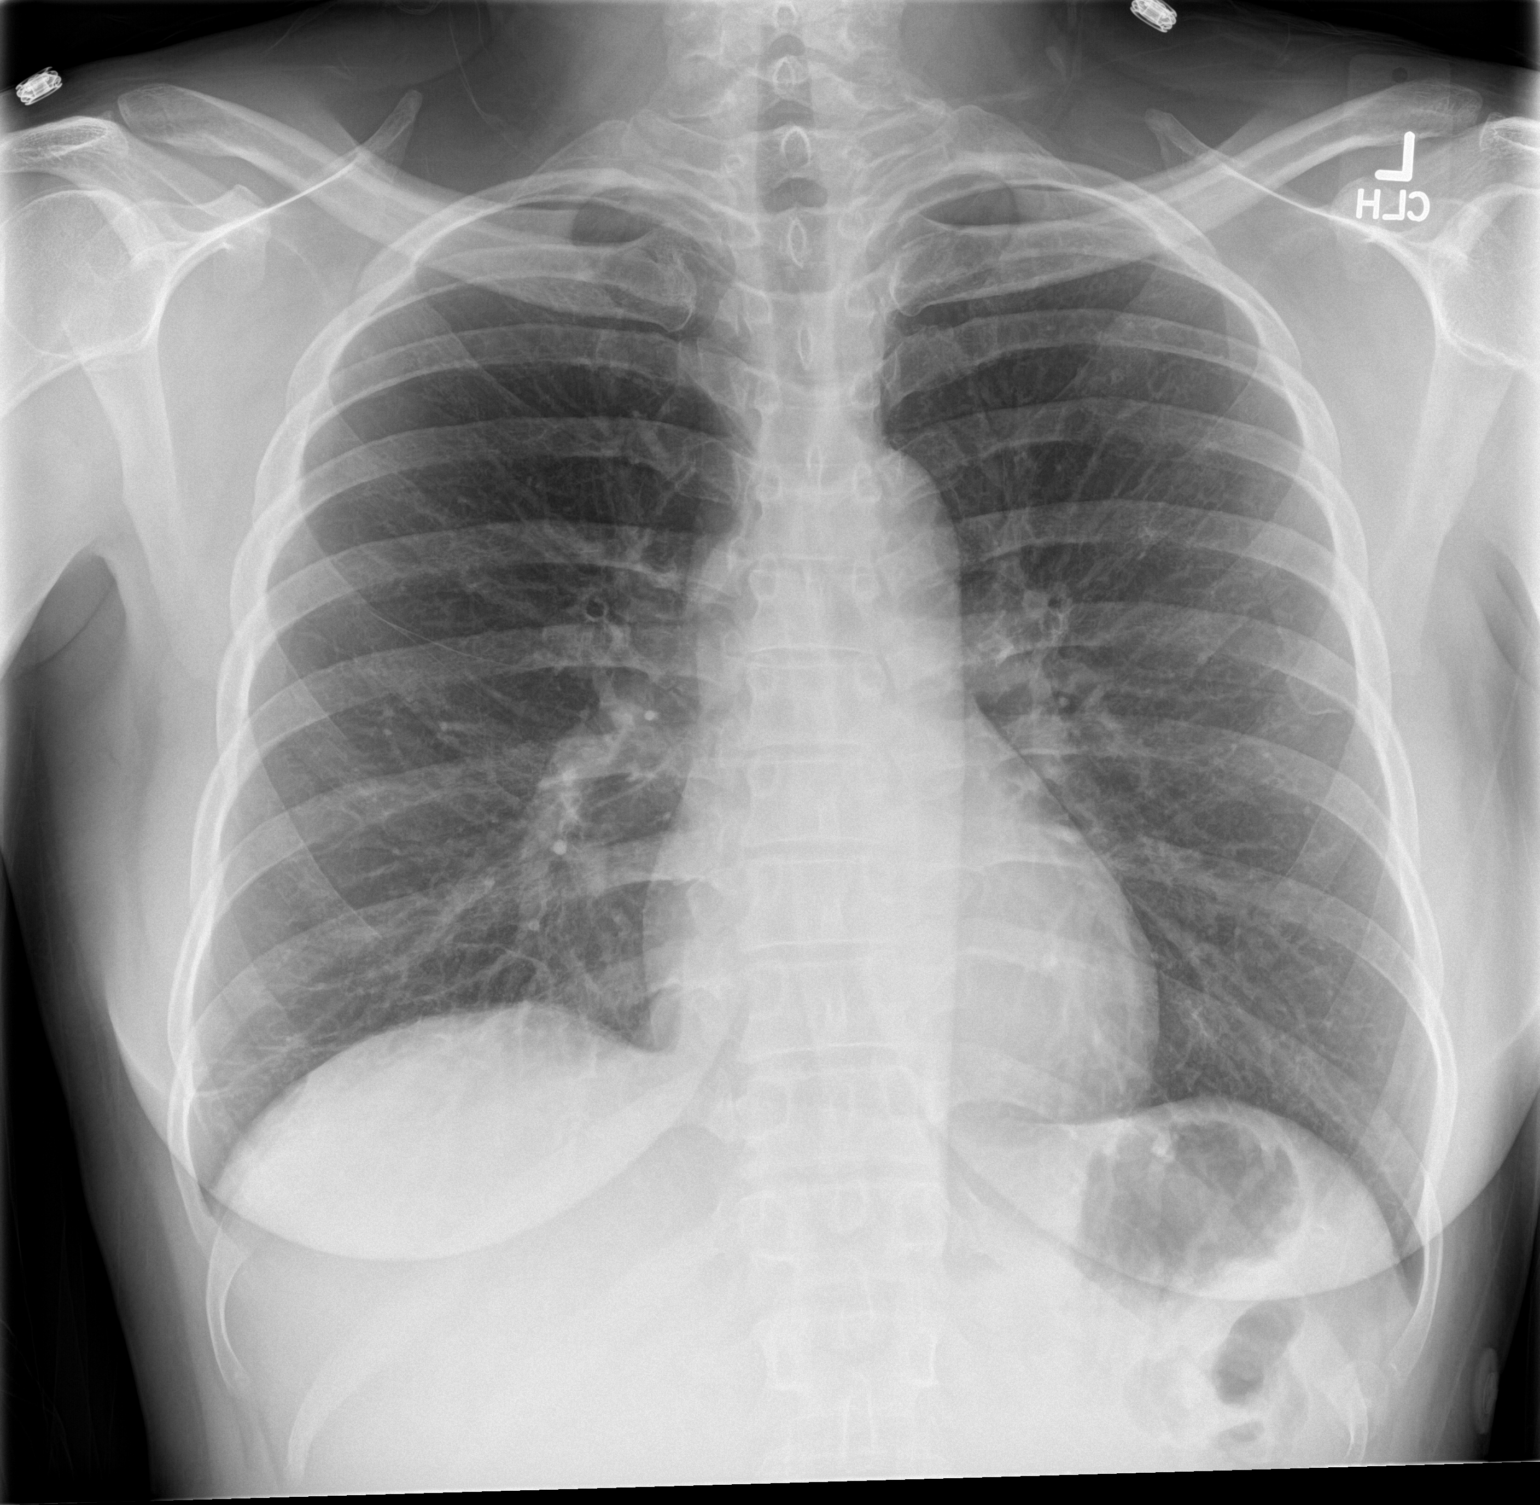

[chest lat]
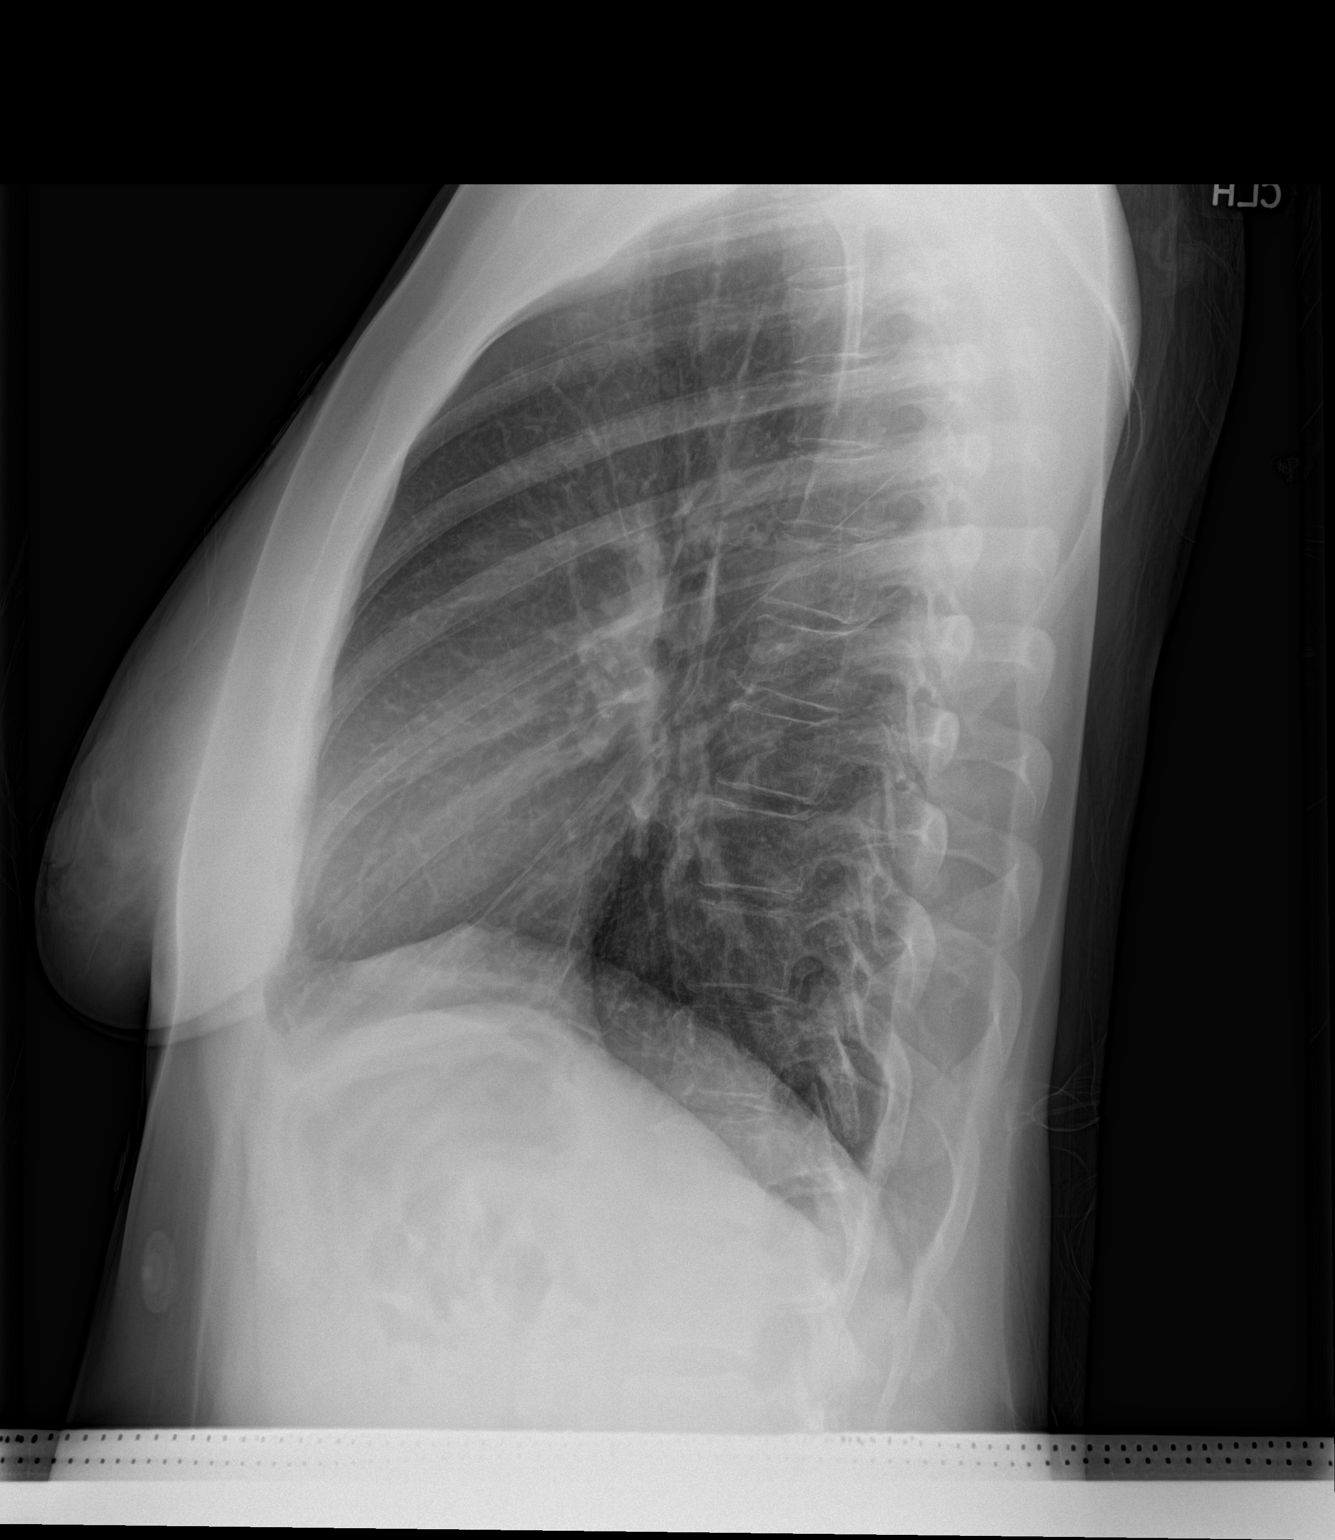

[2 of 2 positions shown; findings below may reference images not displayed]

FINDINGS: The heart size and mediastinal contours are within normal limits.
There is no evidence of pulmonary edema, consolidation,
pneumothorax, nodule or pleural fluid. The visualized skeletal
structures are unremarkable.
IMPRESSION: No active disease.

## 2019-09-22 ENCOUNTER — Ambulatory Visit (INDEPENDENT_AMBULATORY_CARE_PROVIDER_SITE_OTHER): Payer: Medicaid Other | Admitting: Obstetrics

## 2019-09-22 ENCOUNTER — Other Ambulatory Visit (HOSPITAL_COMMUNITY)
Admission: RE | Admit: 2019-09-22 | Discharge: 2019-09-22 | Disposition: A | Discharge status: Home / Self Care | Payer: Medicaid Other | Source: Ambulatory Visit | Attending: Obstetrics | Admitting: Obstetrics

## 2019-09-22 ENCOUNTER — Other Ambulatory Visit: Payer: Self-pay

## 2019-09-22 ENCOUNTER — Encounter: Payer: Self-pay | Admitting: Obstetrics

## 2019-09-22 VITALS — BP 133/96 | HR 98 | Ht 65.0 in | Wt 114.7 lb

## 2019-09-22 DIAGNOSIS — N898 Other specified noninflammatory disorders of vagina: Secondary | ICD-10-CM | POA: Diagnosis present

## 2019-09-22 DIAGNOSIS — N83201 Unspecified ovarian cyst, right side: Secondary | ICD-10-CM

## 2019-09-22 DIAGNOSIS — Z780 Asymptomatic menopausal state: Secondary | ICD-10-CM

## 2019-09-22 DIAGNOSIS — Z01419 Encounter for gynecological examination (general) (routine) without abnormal findings: Secondary | ICD-10-CM | POA: Insufficient documentation

## 2019-09-22 DIAGNOSIS — Z113 Encounter for screening for infections with a predominantly sexual mode of transmission: Secondary | ICD-10-CM

## 2019-09-22 DIAGNOSIS — Z0000 Encounter for general adult medical examination without abnormal findings: Secondary | ICD-10-CM

## 2019-09-22 DIAGNOSIS — Z1272 Encounter for screening for malignant neoplasm of vagina: Secondary | ICD-10-CM

## 2019-09-22 DIAGNOSIS — R634 Abnormal weight loss: Secondary | ICD-10-CM

## 2019-09-22 DIAGNOSIS — Z1239 Encounter for other screening for malignant neoplasm of breast: Secondary | ICD-10-CM

## 2019-09-22 DIAGNOSIS — F17200 Nicotine dependence, unspecified, uncomplicated: Secondary | ICD-10-CM

## 2019-09-22 NOTE — Progress Notes (Signed)
Pt is here as a new patient to establish care. Last pap, unknown. Postmenopausal. Last MMG, unknown. PHQ-9 score: 11.

## 2019-09-22 NOTE — Progress Notes (Signed)
Subjective:        Debra Wallace is a 49 y.o. female here for a routine exam.  Current complaints: No period for 2 years.  Recent weight loss of ~ 70 lbs that she feels is due to stress because she loss her job and has been homeless.    Personal health questionnaire:  Is patient Debra Wallace, have a family history of breast and/or ovarian cancer: no Is there a family history of uterine cancer diagnosed at age < 76, gastrointestinal cancer, urinary tract cancer, family member who is a Field seismologist syndrome-associated carrier: no Is the patient overweight and hypertensive, family history of diabetes, personal history of gestational diabetes, preeclampsia or PCOS: no Is patient over 61, have PCOS,  family history of premature CHD under age 54, diabetes, smoke, have hypertension or peripheral artery disease:  no At any time, has a partner hit, kicked or otherwise hurt or frightened you?: no Over the past 2 weeks, have you felt down, depressed or hopeless?: no Over the past 2 weeks, have you felt little interest or pleasure in doing things?:no   Gynecologic History Patient's last menstrual period was 02/07/2015. Contraception: post menopausal status Last Pap: unknown. Results were: unknown Last mammogram: unknown. Results were: unknown  Obstetric History OB History  Gravida Para Term Preterm AB Living  2 2 2     2   SAB TAB Ectopic Multiple Live Births          2    # Outcome Date GA Lbr Len/2nd Weight Sex Delivery Anes PTL Lv  2 Term      Vag-Spont   LIV  1 Term      Vag-Spont   LIV    Past Medical History:  Diagnosis Date  . Hypertension     Past Surgical History:  Procedure Laterality Date  . FOOT SURGERY     lt. achilles tendon repair     Current Outpatient Medications:  .  diclofenac sodium (VOLTAREN) 1 % GEL, Apply 1 application topically 4 (four) times daily. (Patient not taking: Reported on 03/03/2015), Disp: 100 g, Rfl: 0 No Known Allergies  Social History   Tobacco  Use  . Smoking status: Current Every Day Smoker    Packs/day: 1.00  . Smokeless tobacco: Never Used  Substance Use Topics  . Alcohol use: Yes    Family History  Problem Relation Age of Onset  . Hypertension Mother   . Diabetes Father       Review of Systems  Constitutional: negative for fatigue and weight loss Respiratory: negative for cough and wheezing Cardiovascular: negative for chest pain, fatigue and palpitations Gastrointestinal: negative for abdominal pain and change in bowel habits Musculoskeletal:negative for myalgias Neurological: negative for gait problems and tremors Behavioral/Psych: negative for abusive relationship, depression Endocrine: negative for temperature intolerance    Genitourinary:negative for abnormal menstrual periods, genital lesions, hot flashes, sexual problems and vaginal discharge Integument/breast: negative for breast lump, breast tenderness, nipple discharge and skin lesion(s)    Objective:       BP (!) 133/96   Pulse 98   Ht 5\' 5"  (1.651 m)   Wt 114 lb 11.2 oz (52 kg)   LMP 02/07/2015   BMI 19.09 kg/m  General:   alert  Skin:   no rash or abnormalities  Lungs:   clear to auscultation bilaterally  Heart:   regular rate and rhythm, S1, S2 normal, no murmur, click, rub or gallop  Breasts:   normal without suspicious masses, skin  or nipple changes or axillary nodes  Abdomen:  normal findings: no organomegaly, soft, non-tender and no hernia  Pelvis:  External genitalia: normal general appearance Urinary system: urethral meatus normal and bladder without fullness, nontender Vaginal: normal without tenderness, induration or masses Cervix: normal appearance Adnexa: normal bimanual exam Uterus: anteverted and non-tender, normal size   Lab Review Urine pregnancy test Labs reviewed yes Radiologic studies reviewed no  50% of 20 min visit spent on counseling and coordination of care.   Assessment:     1. Encounter for routine  gynecological examination with Papanicolaou smear of cervix Rx: - Cytology - PAP( Debra Wallace) - Comprehensive metabolic panel - TSH - Hemoglobin A1c - CBC with Differential/Platelet - Ferritin  2. Postmenopausal for 2 years  3. Vaginal discharge Rx: - Cervicovaginal ancillary only( Debra Wallace)  4. Screen for STD (sexually transmitted disease) Rx: - Hepatitis B surface antigen - Hepatitis C antibody - HIV Antibody (routine testing w rflx) - RPR  5. Cyst of right ovary Rx: - US PELVIC COMPLETE WITH TRANSVAGINAL; Future  6. Recent unexplained weight loss  7. Screening breast examination Rx: - MM Digital Screening; Future  8. Tobacco dependence - cessation with medication and behavioral modification recommended  9. Routine adult health maintenance Rx: - Ambulatory referral to Internal Medicine    Plan:    Education reviewed: calcium supplements, depression evaluation, low fat, low cholesterol diet, safe sex/STD prevention, self breast exams, smoking cessation and weight bearing exercise. Mammogram ordered. Follow up in: 2 weeks.   No orders of the defined types were placed in this encounter.  Orders Placed This Encounter  Procedures  . MM Digital Screening    Standing Status:   Future    Standing Expiration Date:   09/21/2020    Order Specific Question:   Reason for Exam (SYMPTOM  OR DIAGNOSIS REQUIRED)    Answer:   Screening.    Order Specific Question:   Is the patient pregnant?    Answer:   No    Order Specific Question:   Preferred imaging location?    Answer:   Debra Wallace  . US PELVIC COMPLETE WITH TRANSVAGINAL    Standing Status:   Future    Standing Expiration Date:   09/21/2020    Order Specific Question:   Reason for Exam (SYMPTOM  OR DIAGNOSIS REQUIRED)    Answer:   Ovarian cyst    Order Specific Question:   Preferred imaging location?    Answer:   Debra Wallace  . Hepatitis B surface antigen  . Hepatitis C antibody  . HIV Antibody  (routine testing w rflx)  . RPR  . Comprehensive metabolic panel  . TSH  . Hemoglobin A1c  . CBC with Differential/Platelet  . Ferritin  . Ambulatory referral to Internal Medicine    Referral Priority:   Routine    Referral Type:   Consultation    Referral Reason:   Specialty Services Required    Requested Specialty:   Internal Medicine    Number of Visits Requested:   1    Brock Bad, MD 09/22/2019 2:48 PM

## 2019-09-23 ENCOUNTER — Other Ambulatory Visit: Payer: Self-pay | Admitting: Obstetrics

## 2019-09-23 DIAGNOSIS — N760 Acute vaginitis: Secondary | ICD-10-CM

## 2019-09-23 LAB — CERVICOVAGINAL ANCILLARY ONLY
Bacterial Vaginitis (gardnerella): POSITIVE — AB
Candida Glabrata: NEGATIVE
Candida Vaginitis: NEGATIVE
Chlamydia: NEGATIVE
Comment: NEGATIVE
Comment: NEGATIVE
Comment: NEGATIVE
Comment: NEGATIVE
Comment: NEGATIVE
Comment: NORMAL
Neisseria Gonorrhea: NEGATIVE
Trichomonas: NEGATIVE

## 2019-09-23 MED ORDER — TINIDAZOLE 500 MG PO TABS: 1000.0000 mg | ORAL_TABLET | Freq: Every day | ORAL | 2 refills | Status: DC

## 2019-09-24 ENCOUNTER — Telehealth: Payer: Self-pay | Admitting: *Deleted

## 2019-09-24 ENCOUNTER — Telehealth (HOSPITAL_COMMUNITY): Payer: Self-pay

## 2019-09-24 ENCOUNTER — Other Ambulatory Visit: Payer: Self-pay | Admitting: Obstetrics

## 2019-09-24 ENCOUNTER — Telehealth: Payer: Self-pay | Admitting: Obstetrics

## 2019-09-24 DIAGNOSIS — B20 Human immunodeficiency virus [HIV] disease: Secondary | ICD-10-CM

## 2019-09-24 LAB — CBC WITH DIFFERENTIAL/PLATELET
Basophils Absolute: 0 10*3/uL (ref 0.0–0.2)
Basos: 1 %
EOS (ABSOLUTE): 0 10*3/uL (ref 0.0–0.4)
Eos: 1 %
Hematocrit: 37.6 % (ref 34.0–46.6)
Hemoglobin: 13.1 g/dL (ref 11.1–15.9)
Immature Grans (Abs): 0.1 10*3/uL (ref 0.0–0.1)
Immature Granulocytes: 3 %
Lymphocytes Absolute: 1.3 10*3/uL (ref 0.7–3.1)
Lymphs: 38 %
MCH: 32.4 pg (ref 26.6–33.0)
MCHC: 34.8 g/dL (ref 31.5–35.7)
MCV: 93 fL (ref 79–97)
Monocytes Absolute: 0.2 10*3/uL (ref 0.1–0.9)
Monocytes: 5 %
Neutrophils Absolute: 1.8 10*3/uL (ref 1.4–7.0)
Neutrophils: 52 %
Platelets: 166 10*3/uL (ref 150–450)
RBC: 4.04 x10E6/uL (ref 3.77–5.28)
RDW: 13.1 % (ref 11.7–15.4)
WBC: 3.4 10*3/uL (ref 3.4–10.8)

## 2019-09-24 LAB — COMPREHENSIVE METABOLIC PANEL
ALT: 97 IU/L — ABNORMAL HIGH (ref 0–32)
AST: 248 IU/L — ABNORMAL HIGH (ref 0–40)
Albumin/Globulin Ratio: 0.5 — ABNORMAL LOW (ref 1.2–2.2)
Albumin: 3 g/dL — ABNORMAL LOW (ref 3.8–4.8)
Alkaline Phosphatase: 279 IU/L — ABNORMAL HIGH (ref 48–121)
BUN/Creatinine Ratio: 6 — ABNORMAL LOW (ref 9–23)
BUN: 3 mg/dL — ABNORMAL LOW (ref 6–24)
Bilirubin Total: 0.3 mg/dL (ref 0.0–1.2)
CO2: 18 mmol/L — ABNORMAL LOW (ref 20–29)
Calcium: 8.2 mg/dL — ABNORMAL LOW (ref 8.7–10.2)
Chloride: 102 mmol/L (ref 96–106)
Creatinine, Ser: 0.48 mg/dL — ABNORMAL LOW (ref 0.57–1.00)
GFR calc Af Amer: 134 mL/min/{1.73_m2} (ref >59)
GFR calc non Af Amer: 116 mL/min/{1.73_m2} (ref >59)
Globulin, Total: 5.8 g/dL — ABNORMAL HIGH (ref 1.5–4.5)
Glucose: 89 mg/dL (ref 65–99)
Potassium: 3.8 mmol/L (ref 3.5–5.2)
Sodium: 138 mmol/L (ref 134–144)
Total Protein: 8.8 g/dL — ABNORMAL HIGH (ref 6.0–8.5)

## 2019-09-24 LAB — HEPATITIS C ANTIBODY: Hep C Virus Ab: 0.1 s/co ratio (ref 0.0–0.9)

## 2019-09-24 LAB — RPR: RPR Ser Ql: NONREACTIVE

## 2019-09-24 LAB — HIV ANTIBODY (ROUTINE TESTING W REFLEX): HIV Screen 4th Generation wRfx: REACTIVE — AB

## 2019-09-24 LAB — TSH: TSH: 3.71 u[IU]/mL (ref 0.450–4.500)

## 2019-09-24 LAB — HIV 1/2 AB DIFFERENTIATION
HIV 1 Ab: POSITIVE — AB
HIV 2 Ab: NEGATIVE
NOTE (HIV CONF MULTIP: POSITIVE — AB

## 2019-09-24 LAB — HEPATITIS B SURFACE ANTIGEN: Hepatitis B Surface Ag: NEGATIVE

## 2019-09-24 LAB — HEMOGLOBIN A1C
Est. average glucose Bld gHb Est-mCnc: 108 mg/dL
Hgb A1c MFr Bld: 5.4 % (ref 4.8–5.6)

## 2019-09-24 LAB — FERRITIN: Ferritin: 922 ng/mL — ABNORMAL HIGH (ref 15–150)

## 2019-09-24 NOTE — Telephone Encounter (Signed)
Called patient and informed her of positive HIV testing, and that she would be referred to Infectious Disease Clinic at [facility] for further evaluation and treatment.  All questions answered to her satisfaction.

## 2019-09-24 NOTE — Telephone Encounter (Signed)
-----   Message from Blanchard Kelch, NP sent at 09/24/2019  8:34 AM EDT ----- New diagnosis from what I can see with +HIV-1 Ab at recent encounter to establish care for GYN.

## 2019-09-24 NOTE — Telephone Encounter (Signed)
Sending referral to [facility]. Andree Coss, RN

## 2019-09-24 NOTE — Telephone Encounter (Signed)
Spoke with [facility] regarding pt HIV lab results. States that the no longer reflex to Western Blot testing, they now run HIV 1/2 Differentiation testing.  Pt was Positive for HIV-1.  [facility] states this is used for there definitive testing.  There is also an RNA test that could be ran if provider chooses.    Please place call to pt and discuss results.

## 2019-09-24 NOTE — Telephone Encounter (Signed)
Yes needs to come into care and get on ARV. DIs then?

## 2019-09-28 ENCOUNTER — Telehealth: Payer: Self-pay

## 2019-09-28 ENCOUNTER — Telehealth: Payer: Self-pay | Admitting: *Deleted

## 2019-09-28 LAB — CYTOLOGY - PAP
Comment: NEGATIVE
High risk HPV: NEGATIVE

## 2019-09-28 MED ORDER — METRONIDAZOLE 500 MG PO TABS: 500.0000 mg | ORAL_TABLET | Freq: Two times a day (BID) | ORAL | 0 refills | Status: DC

## 2019-09-28 NOTE — Telephone Encounter (Signed)
Debra Wallace, can you contact the patient to schedule new B20 labs, financial, MD, and pharmacy appointments please? Thank you!

## 2019-09-28 NOTE — Telephone Encounter (Signed)
-----   Message from Randall Hiss, MD sent at 09/25/2019  1:27 PM EDT ----- Looks like Gyn did make aware so we should get her int o care ASAP ----- Message ----- From: Blanchard Kelch, NP Sent: 09/24/2019   9:37 AM EDT To: Randall Hiss, MD  Not that I can see - looks like she was just seen to establish care at GYN office here locally and it was done as part of routine STI screening. I would hope it was discussed at the clinic visit.   ----- Message ----- From: Daiva Eves, Lisette Grinder, MD Sent: 09/24/2019   9:29 AM EDT To: Blanchard Kelch, NP, Veryl Speak, FNP, #  Is he aware of diagnosis if so lets bring her in ----- Message ----- From: Blanchard Kelch, NP Sent: 09/24/2019   8:34 AM EDT To: Randall Hiss, MD, Veryl Speak, FNP, #  New diagnosis from what I can see with +HIV-1 Ab at recent encounter to establish care for GYN.

## 2019-09-28 NOTE — Telephone Encounter (Signed)
Called pt to advise that rx was changed from tinidazole to metronidazole due to insurance, no answer, left vm to call.

## 2019-10-08 ENCOUNTER — Other Ambulatory Visit: Payer: Self-pay

## 2019-10-08 DIAGNOSIS — Z113 Encounter for screening for infections with a predominantly sexual mode of transmission: Secondary | ICD-10-CM

## 2019-10-08 DIAGNOSIS — Z79899 Other long term (current) drug therapy: Secondary | ICD-10-CM

## 2019-10-08 DIAGNOSIS — B20 Human immunodeficiency virus [HIV] disease: Secondary | ICD-10-CM

## 2019-10-11 ENCOUNTER — Other Ambulatory Visit: Payer: Self-pay

## 2019-10-11 ENCOUNTER — Encounter: Payer: Self-pay | Admitting: Student

## 2019-10-11 ENCOUNTER — Ambulatory Visit: Payer: Self-pay

## 2019-10-11 DIAGNOSIS — B20 Human immunodeficiency virus [HIV] disease: Secondary | ICD-10-CM

## 2019-10-12 LAB — T-HELPER CELL (CD4) - (RCID CLINIC ONLY)
CD4 % Helper T Cell: 14 % — ABNORMAL LOW (ref 33–65)
CD4 T Cell Abs: 136 /uL — ABNORMAL LOW (ref 400–1790)

## 2019-10-13 LAB — CBC WITH DIFFERENTIAL/PLATELET
Absolute Monocytes: 189 cells/uL — ABNORMAL LOW (ref 200–950)
Basophils Absolute: 21 cells/uL (ref 0–200)
Basophils Relative: 0.7 %
Eosinophils Absolute: 9 cells/uL — ABNORMAL LOW (ref 15–500)
Eosinophils Relative: 0.3 %
HCT: 33.9 % — ABNORMAL LOW (ref 35.0–45.0)
Hemoglobin: 11.5 g/dL — ABNORMAL LOW (ref 11.7–15.5)
Lymphs Abs: 1104 cells/uL (ref 850–3900)
MCH: 31.9 pg (ref 27.0–33.0)
MCHC: 33.9 g/dL (ref 32.0–36.0)
MCV: 93.9 fL (ref 80.0–100.0)
MPV: 9.5 fL (ref 7.5–12.5)
Monocytes Relative: 6.3 %
Neutro Abs: 1677 cells/uL (ref 1500–7800)
Neutrophils Relative %: 55.9 %
Platelets: 214 10*3/uL (ref 140–400)
RBC: 3.61 10*6/uL — ABNORMAL LOW (ref 3.80–5.10)
RDW: 13 % (ref 11.0–15.0)
Total Lymphocyte: 36.8 %
WBC: 3 10*3/uL — ABNORMAL LOW (ref 3.8–10.8)

## 2019-10-13 LAB — COMPREHENSIVE METABOLIC PANEL
AG Ratio: 0.6 (calc) — ABNORMAL LOW (ref 1.0–2.5)
ALT: 37 U/L — ABNORMAL HIGH (ref 6–29)
AST: 44 U/L — ABNORMAL HIGH (ref 10–35)
Albumin: 2.9 g/dL — ABNORMAL LOW (ref 3.6–5.1)
Alkaline phosphatase (APISO): 177 U/L — ABNORMAL HIGH (ref 31–125)
BUN/Creatinine Ratio: 11 (calc) (ref 6–22)
BUN: 5 mg/dL — ABNORMAL LOW (ref 7–25)
CO2: 25 mmol/L (ref 20–32)
Calcium: 8.4 mg/dL — ABNORMAL LOW (ref 8.6–10.2)
Chloride: 103 mmol/L (ref 98–110)
Creat: 0.46 mg/dL — ABNORMAL LOW (ref 0.50–1.10)
Globulin: 4.9 g/dL (calc) — ABNORMAL HIGH (ref 1.9–3.7)
Glucose, Bld: 85 mg/dL (ref 65–99)
Potassium: 3.9 mmol/L (ref 3.5–5.3)
Sodium: 136 mmol/L (ref 135–146)
Total Bilirubin: 0.5 mg/dL (ref 0.2–1.2)
Total Protein: 7.8 g/dL (ref 6.1–8.1)

## 2019-10-13 LAB — HIV-1 RNA QUANT-NO REFLEX-BLD
HIV 1 RNA Quant: 140000 copies/mL — ABNORMAL HIGH
HIV-1 RNA Quant, Log: 5.15 Log copies/mL — ABNORMAL HIGH

## 2019-10-18 ENCOUNTER — Encounter: Payer: Self-pay | Admitting: Obstetrics

## 2019-10-18 ENCOUNTER — Other Ambulatory Visit (HOSPITAL_COMMUNITY)
Admission: RE | Admit: 2019-10-18 | Discharge: 2019-10-18 | Disposition: A | Discharge status: Home / Self Care | Payer: Medicaid Other | Source: Ambulatory Visit | Attending: Obstetrics | Admitting: Obstetrics

## 2019-10-18 ENCOUNTER — Other Ambulatory Visit: Payer: Self-pay

## 2019-10-18 ENCOUNTER — Ambulatory Visit (INDEPENDENT_AMBULATORY_CARE_PROVIDER_SITE_OTHER): Payer: Medicaid Other | Admitting: Obstetrics

## 2019-10-18 VITALS — BP 145/93 | HR 107 | Ht 65.0 in | Wt 117.0 lb

## 2019-10-18 DIAGNOSIS — R87612 Low grade squamous intraepithelial lesion on cytologic smear of cervix (LGSIL): Secondary | ICD-10-CM

## 2019-10-18 LAB — POCT URINE PREGNANCY: Preg Test, Ur: NEGATIVE

## 2019-10-18 NOTE — Progress Notes (Signed)
Pt is in the office for colpo procedure. 

## 2019-10-18 NOTE — Progress Notes (Signed)
Colposcopy Procedure Note  Indications: Pap smear 1 months ago showed: low-grade squamous intraepithelial neoplasia (LGSIL - encompassing HPV,mild dysplasia,CIN I). The prior pap showed abnormalities, but not report available.  Prior cervical/vaginal disease: unknown. Prior cervical treatment: no treatment.  Procedure Details  The risks and benefits of the procedure and Written informed consent obtained.  A time-out was performed confirming the patient, procedure and allergy status  Speculum placed in vagina and excellent visualization of cervix achieved, cervix swabbed x 3 with acetic acid solution.  Findings: Cervix: no visible lesions, no mosaicism, no punctation and no abnormal vasculature; SCJ visualized 360 degrees without lesions, endocervical curettage performed, cervical biopsies taken at 6 and 12 o'clock, specimen labelled and sent to pathology and hemostasis achieved with silver nitrate.   Vaginal inspection: normal without visible lesions. Vulvar colposcopy: vulvar colposcopy not performed.   Physical Exam   Specimens: ECC and Cervical Biopsies  Complications: none.  Plan: Specimens labelled and sent to Pathology. Will base further treatment on Pathology findings. Treatment options discussed with patient. Post biopsy instructions given to patient. Return to discuss Pathology results in 2 weeks   Brock Bad, MD 10/18/2019 2:37 PM.

## 2019-10-20 LAB — SURGICAL PATHOLOGY

## 2019-10-26 ENCOUNTER — Encounter: Payer: Medicaid Other | Admitting: Infectious Diseases

## 2019-10-26 ENCOUNTER — Ambulatory Visit: Payer: Medicaid Other | Admitting: Pharmacist

## 2019-10-26 ENCOUNTER — Telehealth: Payer: Self-pay | Admitting: Pharmacy Technician

## 2019-10-26 NOTE — Telephone Encounter (Signed)
[facility] Patient Product/process development scientist completed.    The patient is uninsured (has [place] Medicaid family planning that will not cover needed medication) and will need patient assistance for medication.  We can complete the application and will need to meet with the patient for signatures and income documentation.  Netty Starring. Dimas Aguas CPhT Specialty Pharmacy Patient [facility] Phone: [phone] Fax:  [phone]

## 2019-10-27 ENCOUNTER — Ambulatory Visit: Payer: Medicaid Other

## 2019-11-04 ENCOUNTER — Ambulatory Visit: Payer: Medicaid Other

## 2019-11-23 ENCOUNTER — Encounter: Payer: Self-pay | Admitting: Infectious Diseases

## 2019-11-23 ENCOUNTER — Other Ambulatory Visit: Payer: Self-pay

## 2019-11-23 ENCOUNTER — Ambulatory Visit: Payer: Medicaid Other | Admitting: Pharmacist

## 2019-11-23 ENCOUNTER — Ambulatory Visit (INDEPENDENT_AMBULATORY_CARE_PROVIDER_SITE_OTHER): Payer: Self-pay | Admitting: Infectious Diseases

## 2019-11-23 VITALS — BP 142/101 | HR 92 | Temp 98.9°F | Wt 119.0 lb

## 2019-11-23 DIAGNOSIS — Z21 Asymptomatic human immunodeficiency virus [HIV] infection status: Secondary | ICD-10-CM | POA: Insufficient documentation

## 2019-11-23 DIAGNOSIS — Z0000 Encounter for general adult medical examination without abnormal findings: Secondary | ICD-10-CM

## 2019-11-23 DIAGNOSIS — R87619 Unspecified abnormal cytological findings in specimens from cervix uteri: Secondary | ICD-10-CM

## 2019-11-23 DIAGNOSIS — R634 Abnormal weight loss: Secondary | ICD-10-CM

## 2019-11-23 DIAGNOSIS — R87612 Low grade squamous intraepithelial lesion on cytologic smear of cervix (LGSIL): Secondary | ICD-10-CM

## 2019-11-23 DIAGNOSIS — B20 Human immunodeficiency virus [HIV] disease: Secondary | ICD-10-CM

## 2019-11-23 HISTORY — DX: Human immunodeficiency virus (HIV) disease: B20

## 2019-11-23 MED ORDER — BIKTARVY 50-200-25 MG PO TABS
1.0000 | ORAL_TABLET | Freq: Every day | ORAL | 5 refills | Status: DC
Start: 2019-11-23 — End: 2020-01-24

## 2019-11-23 MED ORDER — SULFAMETHOXAZOLE-TRIMETHOPRIM 400-80 MG PO TABS
1.0000 | ORAL_TABLET | Freq: Every day | ORAL | 5 refills | Status: DC
Start: 2019-11-23 — End: 2020-01-24

## 2019-11-23 NOTE — Assessment & Plan Note (Signed)
New patient here to establish for HIV care. Treatment naive with wildtype virus. Ready to start medications. CD4 nadir 146 today - no findings of OI on exam - will start prophylactic Bactrim.   I discussed with Waynard Reeds treatment options/side effects, benefits of treatment and long-term outcomes. I discussed how HIV is transmitted and the process of untreated HIV including increased risk for opportunistic infections, cancer, dementia and renal failure. Patient was counseled on routine HIV care including medication adherence, blood monitoring, necessary vaccines and follow up visits. Counseled regarding safe sex practices including: condom use, partner disclosure, limiting partners. Discussed successful practices of ART and understands to reach out to our clinic in the future with questions.   Will start BIKTARVY for HIV treatment.   General introduction to our clinic and integrated services. Introduced to [place] and [facility] today. Dental referral placed today for [facility]. Information to schedule appointment completed today.   I spent greater than 45 minutes with the patient today. Greater than 50% of the time spent face-to-face counseling and coordination of care re: HIV and health maintenance.

## 2019-11-23 NOTE — Progress Notes (Signed)
Name: Debra Wallace  DOB: June 23, 1970 MRN: [id number] PCP: Patient, No Pcp Per    Patient Active Problem List   Diagnosis Date Noted   Weight loss 11/29/2019   Abnormal Pap smear of cervix 11/29/2019   Healthcare maintenance 11/29/2019   HIV (human immunodeficiency virus infection) ([place]) 11/23/2019     Brief Narrative:  Debra Wallace is a 49 y.o. female with HIV disease and AIDS, Dx at routine GYN care visit 08-2019 CD4 nadir 136 HIV Risk: heterosexual  History of OIs: none Intake Labs 08-2019: Hep B sAg (-), sAb (), cAb (); Hep A (), Hep C (-) Quantiferon () HLA B*5701 () G6PD: ()   Previous Regimens:  Naive   Genotypes:  08-2019: Wildtype   Subjective:   Chief Complaint  Patient presents with   New Patient (Initial Visit)    B20     HPI: Debra Wallace is here today for her first visit for treatment of newly diagnosed HIV disease and AIDS (CD4 nadir 136). She was tested after recommended by new GYN team she started seeing for general health maintenance. She has noticed some weight loss and low energy. Used to be an athlete in school but weight / muscle mass has been dropping off slowly for a few months now. Otherwise reports no complaints today suggestive of associated opportunistic infection or advancing HIV disease such as fevers, night sweats, anorexia, cough, SOB, nausea, vomiting, diarrhea, headache, sensory changes, lymphadenopathy or oral thrush.    She is not currently sexually active but prefers female partners. No previous HIV+ partners to her knowledge and has not had a high number of partners. No history of drug use. Drinks alcohol regularly and has been trying to decrease her intake recently after liver function tests have   Feels like she is eating and sleeping well. No concern at this time for depressed or anxious mood.   Pap smear was done recently revealing LSIL on cytology. High Risk HPV negative.    Review of Systems  Constitutional: Positive for weight  loss. Negative for chills, fever and malaise/fatigue.  HENT: Negative for sore throat.   Respiratory: Negative for cough, sputum production and shortness of breath.   Cardiovascular: Negative.   Gastrointestinal: Negative for abdominal pain, diarrhea and vomiting.  Musculoskeletal: Negative for joint pain, myalgias and neck pain.  Skin: Negative for rash.  Neurological: Negative for headaches.  Psychiatric/Behavioral: Negative for depression and substance abuse. The patient is not nervous/anxious.     Past Medical History:  Diagnosis Date   HIV (human immunodeficiency virus infection) ([place]) 11/23/2019   Dx 09/2019 CD4 nadir 136   Hypertension     Outpatient Medications Prior to Visit  Medication Sig Dispense Refill   diclofenac sodium (VOLTAREN) 1 % GEL Apply 1 application topically 4 (four) times daily. (Patient not taking: Reported on 03/03/2015) 100 g 0   metroNIDAZOLE (FLAGYL) 500 MG tablet Take 1 tablet (500 mg total) by mouth 2 (two) times daily. (Patient not taking: Reported on 10/18/2019) 14 tablet 0   tinidazole (TINDAMAX) 500 MG tablet Take 2 tablets (1,000 mg total) by mouth daily with breakfast. (Patient not taking: Reported on 10/18/2019) 10 tablet 2   No facility-administered medications prior to visit.     No Known Allergies  Social History   Tobacco Use   Smoking status: Current Every Day Smoker    Packs/day: 1.00   Smokeless tobacco: Never Used  Substance Use Topics   Alcohol use: Yes    Comment: occ  Drug use: No    Family History  Problem Relation Age of Onset   Hypertension Mother    Diabetes Father     Social History   Substance and Sexual Activity  Sexual Activity Not Currently   Partners: Male   Birth control/protection: Condom     Objective:   Vitals:   11/23/19 1503  BP: (!) 142/101  Pulse: 92  Temp: 98.9 F (37.2 C)  TempSrc: Oral  Weight: 119 lb (54 kg)   Body mass index is 19.8 kg/m.  Physical Exam Vitals  reviewed.  HENT:     Mouth/Throat:     Mouth: No oral lesions.     Dentition: No dental abscesses.  Cardiovascular:     Rate and Rhythm: Normal rate and regular rhythm.     Heart sounds: Normal heart sounds.  Pulmonary:     Effort: Pulmonary effort is normal.     Breath sounds: Normal breath sounds.  Abdominal:     General: There is no distension.     Palpations: Abdomen is soft.     Tenderness: There is no abdominal tenderness.  Musculoskeletal:        General: No tenderness. Normal range of motion.  Lymphadenopathy:     Cervical: No cervical adenopathy.  Skin:    General: Skin is warm and dry.     Findings: No rash.  Neurological:     Mental Status: She is alert and oriented to person, place, and time.  Psychiatric:        Judgment: Judgment normal.     Lab Results Lab Results  Component Value Date   WBC 3.0 (L) 10/11/2019   HGB 11.5 (L) 10/11/2019   HCT 33.9 (L) 10/11/2019   MCV 93.9 10/11/2019   PLT 214 10/11/2019    Lab Results  Component Value Date   CREATININE 0.46 (L) 10/11/2019   BUN 5 (L) 10/11/2019   NA 136 10/11/2019   K 3.9 10/11/2019   CL 103 10/11/2019   CO2 25 10/11/2019    Lab Results  Component Value Date   ALT 37 (H) 10/11/2019   AST 44 (H) 10/11/2019   ALKPHOS 279 (H) 09/22/2019   BILITOT 0.5 10/11/2019    No results found for: CHOL, HDL, LDLCALC, LDLDIRECT, TRIG, CHOLHDL HIV 1 RNA Quant (copies/mL)  Date Value  10/11/2019 140,000 (H)   CD4 T Cell Abs (/uL)  Date Value  10/11/2019 136 (L)     Assessment & Plan:   Problem List Items Addressed This Visit      Unprioritized   HIV (human immunodeficiency virus infection) ([place])    New patient here to establish for HIV care. Treatment naive with wildtype virus. Ready to start medications. CD4 nadir 146 today - no findings of OI on exam - will start prophylactic Bactrim.   I discussed with Erik Obey treatment options/side effects, benefits of treatment and long-term outcomes. I  discussed how HIV is transmitted and the process of untreated HIV including increased risk for opportunistic infections, cancer, dementia and renal failure. Patient was counseled on routine HIV care including medication adherence, blood monitoring, necessary vaccines and follow up visits. Counseled regarding safe sex practices including: condom use, partner disclosure, limiting partners. Discussed successful practices of ART and understands to reach out to our clinic in the future with questions.   Will start [place] for HIV treatment.   General introduction to our clinic and integrated services. Introduced to [place] and [place] today. Dental referral placed today for  [facility]. Information to schedule appointment completed today.   I spent greater than 45 minutes with the patient today. Greater than 50% of the time spent face-to-face counseling and coordination of care re: HIV and health maintenance.        Relevant Medications   bictegravir-emtricitabine-tenofovir AF (BIKTARVY) 50-200-25 MG TABS tablet   sulfamethoxazole-trimethoprim (BACTRIM) 400-80 MG tablet   Weight loss    Likely due to advancing HIV infection. I suspect she will rebound quickly once we get her on medications regularly.       Abnormal Pap smear of cervix    Would suggest repeating test in 1 year x 3 or if cytology/HPV status dictates otherwise with newly diagnosed HIV.       Healthcare maintenance    Will hold on recommended vaccines for a few months with anticipation of recovery of CD4 once we start medications.        Other Visit Diagnoses    AIDS (acquired immune deficiency syndrome) ([place])    -  Primary   Relevant Medications   bictegravir-emtricitabine-tenofovir AF (BIKTARVY) 50-200-25 MG TABS tablet   sulfamethoxazole-trimethoprim (BACTRIM) 400-80 MG tablet   Other Relevant Orders   QuantiFERON-TB Gold Plus   HLA B*5701   Hepatitis B Core Antibody, total   Hepatitis B surface antibody,qualitative     Hepatitis A Ab, Total   HIV-1 RNA quant-no reflex-bld   T-helper cell (CD4)- ([facility] clinic only)      Janene Madeira, MSN, NP-C [facility] [place] Pager: [phone] Office: [phone]  11/29/19  12:33 PM

## 2019-11-23 NOTE — Telephone Encounter (Signed)
UPDATE:  Insurance information remains the same.  Debra Wallace. Debra Wallace CPhT Specialty Pharmacy Patient [facility] for [facility]ious [facility] Phone: [phone] Fax:  [phone]

## 2019-11-23 NOTE — Patient Instructions (Addendum)
So nice to meet you today and welcome to our clinic.   I look forward to working with you and keeping you healthy.   Biktarvy is the pill I would like for you to start taking to treat you - this will need to be taken once a day around the same time.  - Common side effects for a short time frame usually include headaches, nausea and diarrhea - OK to take over the counter tylenol for headaches and imodium for diarrhea - Try taking with food if you are nauseated  - If you take any multivitamins or supplements please separate them from your Biktarvy by 6 hours before and after.  The main thing is do not have them in the stomach at the same time.  For now I would also like you to take a medication called Bactrim once a day for the next 3 months at least. This is to protect your immune system while Biktarvy works.    You blood work shows that you have some inflammation in your liver - I would like for you to try to limit alcohol to none if possible - if you cannot get there yet I would like to see no more than 1 standard drink a day.    Will help get you enrolled with our dental team and assistance for mammograms.   Would repeat your pap smear in 6 months   Please come back to see either me or Cassie, our pharmacist in 4 weeks. We will repeat some blood work at that visit to ensure your medication is working nicely.

## 2019-11-29 DIAGNOSIS — R87619 Unspecified abnormal cytological findings in specimens from cervix uteri: Secondary | ICD-10-CM | POA: Insufficient documentation

## 2019-11-29 DIAGNOSIS — Z0000 Encounter for general adult medical examination without abnormal findings: Secondary | ICD-10-CM | POA: Insufficient documentation

## 2019-11-29 DIAGNOSIS — R634 Abnormal weight loss: Secondary | ICD-10-CM | POA: Insufficient documentation

## 2019-11-29 NOTE — Assessment & Plan Note (Signed)
Will hold on recommended vaccines for a few months with anticipation of recovery of CD4 once we start medications.

## 2019-11-29 NOTE — Assessment & Plan Note (Signed)
Likely due to advancing HIV infection. I suspect she will rebound quickly once we get her on medications regularly.

## 2019-11-29 NOTE — Assessment & Plan Note (Signed)
Would suggest repeating test in 1 year x 3 or if cytology/HPV status dictates otherwise with newly diagnosed HIV.

## 2019-12-22 ENCOUNTER — Ambulatory Visit (INDEPENDENT_AMBULATORY_CARE_PROVIDER_SITE_OTHER): Payer: Self-pay | Admitting: Infectious Diseases

## 2019-12-22 ENCOUNTER — Ambulatory Visit: Payer: Self-pay

## 2019-12-22 ENCOUNTER — Encounter: Payer: Self-pay | Admitting: Infectious Diseases

## 2019-12-22 ENCOUNTER — Other Ambulatory Visit: Payer: Self-pay

## 2019-12-22 VITALS — BP 138/103 | HR 98 | Temp 98.0°F | Ht 65.0 in | Wt 125.6 lb

## 2019-12-22 DIAGNOSIS — Z7289 Other problems related to lifestyle: Secondary | ICD-10-CM

## 2019-12-22 DIAGNOSIS — L282 Other prurigo: Secondary | ICD-10-CM

## 2019-12-22 DIAGNOSIS — K0889 Other specified disorders of teeth and supporting structures: Secondary | ICD-10-CM

## 2019-12-22 DIAGNOSIS — Z789 Other specified health status: Secondary | ICD-10-CM

## 2019-12-22 DIAGNOSIS — Z21 Asymptomatic human immunodeficiency virus [HIV] infection status: Secondary | ICD-10-CM

## 2019-12-22 DIAGNOSIS — B20 Human immunodeficiency virus [HIV] disease: Secondary | ICD-10-CM | POA: Insufficient documentation

## 2019-12-22 MED ORDER — BIKTARVY 50-200-25 MG PO TABS
1.0000 | ORAL_TABLET | Freq: Every day | ORAL | 11 refills | Status: DC
Start: 2019-12-22 — End: 2020-01-24

## 2019-12-22 MED ORDER — AMOXICILLIN 500 MG PO CAPS: 500.0000 mg | ORAL_CAPSULE | Freq: Three times a day (TID) | ORAL | 0 refills | Status: AC

## 2019-12-22 MED ORDER — PREDNISONE 10 MG (21) PO TBPK
ORAL_TABLET | ORAL | 0 refills | Status: DC
Start: 2019-12-22 — End: 2020-01-24

## 2019-12-22 NOTE — Assessment & Plan Note (Signed)
Swollen erythematous gumline on the left lower mandible. No palpable lymph nodes. She has mandibular swelling present and tenderness.   Will give 10-day course of amoxicillin and have her schedule urgently with dental team. Salt water rinses. Declined medicated mouth wash d/t cost.  I worry she is developing an abscess. No trismus present.  Will have her call next week with an update as to her condition. ER precautions discussed.

## 2019-12-22 NOTE — Progress Notes (Signed)
Name: Debra Wallace  DOB: 1970-06-15 MRN: [id number] PCP: Patient, No Pcp Per    Patient Active Problem List   Diagnosis Date Noted  . Pain, dental 12/22/2019  . Pruritic rash 12/22/2019  . AIDS (acquired immune deficiency syndrome) ([place]) 12/22/2019  . Alcohol use 12/22/2019  . Weight loss 11/29/2019  . Abnormal Pap smear of cervix 11/29/2019  . Healthcare maintenance 11/29/2019  . HIV (human immunodeficiency virus infection) ([place]) 11/23/2019     Brief Narrative:  Debra Wallace is a 49 y.o. female with HIV disease and AIDS, Dx at routine GYN care visit 08-2019 CD4 nadir 136 HIV Risk: heterosexual  History of OIs: none Intake Labs 08-2019: Hep B sAg (-), sAb (), cAb (); Hep A (), Hep C (-) Quantiferon () HLA B*5701 () G6PD: ()   Previous Regimens: . Naive   Genotypes: . 08-2019: Wildtype   Subjective:   No chief complaint on file.    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 High Risk HPV negative.    Review of Systems  Constitutional: Positive for weight loss. Negative for chills, fever and malaise/fatigue.  HENT: Negative for sore throat.   Respiratory: Negative for cough, sputum production and shortness of breath.   Cardiovascular: Negative.   Gastrointestinal: Negative for abdominal pain, diarrhea and vomiting.  Musculoskeletal: Negative for joint pain, myalgias and neck pain.  Skin: Negative for rash.  Neurological: Negative for headaches.  Psychiatric/Behavioral: Negative for depression and substance abuse. The patient is not nervous/anxious.     Past Medical History:  Diagnosis Date  . HIV (human immunodeficiency virus infection) ([place]) 11/23/2019   Dx 09/2019 CD4 nadir 136  . Hypertension     Outpatient Medications Prior to Visit  Medication Sig Dispense Refill  . bictegravir-emtricitabine-tenofovir AF (BIKTARVY) 50-200-25 MG TABS tablet Take 1 tablet by mouth daily. (Patient  not taking: Reported on 12/22/2019) 30 tablet 5  . diclofenac sodium (VOLTAREN) 1 % GEL Apply 1 application topically 4 (four) times daily. (Patient not taking: Reported on 12/22/2019) 100 g 0  . metroNIDAZOLE (FLAGYL) 500 MG tablet Take 1 tablet (500 mg total) by mouth 2 (two) times daily. (Patient not taking: Reported on 12/22/2019) 14 tablet 0  . sulfamethoxazole-trimethoprim (BACTRIM) 400-80 MG tablet Take 1 tablet by mouth daily. (Patient not taking: Reported on 12/22/2019) 30 tablet 5  . tinidazole (TINDAMAX) 500 MG tablet Take 2 tablets (1,000 mg total) by mouth daily with breakfast. (Patient not taking: Reported on 12/22/2019) 10 tablet 2   No facility-administered medications prior to visit.     No Known Allergies  Social History   Tobacco Use  . Smoking status: Current Every Day Smoker    Packs/day: 1.00  . Smokeless tobacco: Never Used  Substance Use Topics  . Alcohol use: Yes    Comment: occ  . Drug use: No    Family History  Problem Relation Age of Onset  . Hypertension Mother   . Diabetes Father     Social History   Substance and Sexual Activity  Sexual Activity Not Currently  . Partners: Male  . Birth control/protection: Condom     Objective:   Vitals:   12/22/19 1527  BP: (!) 138/103  Pulse: 98  Temp: 98 F (36.7 C)  Weight: 125 lb 9.6 oz (57 kg)  Height: _0  (1.651 m)   Body mass index is 20.9 kg/m.  Physical  Exam Vitals reviewed.  HENT:     Mouth/Throat:     Mouth: No oral lesions.     Dentition: No dental abscesses.  Cardiovascular:     Rate and Rhythm: Normal rate and regular rhythm.     Heart sounds: Normal heart sounds.  Pulmonary:     Effort: Pulmonary effort is normal.     Breath sounds: Normal breath sounds.  Abdominal:     General: There is no distension.     Palpations: Abdomen is soft.     Tenderness: There is no abdominal tenderness.  Musculoskeletal:        General: No tenderness. Normal range of motion.    Lymphadenopathy:     Cervical: No cervical adenopathy.  Skin:    General: Skin is warm and dry.     Findings: No rash.  Neurological:     Mental Status: She is alert and oriented to person, place, and time.  Psychiatric:        Judgment: Judgment normal.     Lab Results Lab Results  Component Value Date   WBC 3.0 (L) 10/11/2019   HGB 11.5 (L) 10/11/2019   HCT 33.9 (L) 10/11/2019   MCV 93.9 10/11/2019   PLT 214 10/11/2019    Lab Results  Component Value Date   CREATININE 0.46 (L) 10/11/2019   BUN 5 (L) 10/11/2019   NA 136 10/11/2019   K 3.9 10/11/2019   CL 103 10/11/2019   CO2 25 10/11/2019    Lab Results  Component Value Date   ALT 37 (H) 10/11/2019   AST 44 (H) 10/11/2019   ALKPHOS 279 (H) 09/22/2019   BILITOT 0.5 10/11/2019    No results found for: CHOL, HDL, LDLCALC, LDLDIRECT, TRIG, CHOLHDL HIV 1 RNA Quant (copies/mL)  Date Value  10/11/2019 140,000 (H)   CD4 T Cell Abs (/uL)  Date Value  10/11/2019 136 (L)     Assessment & Plan:   Problem List Items Addressed This Visit      Unprioritized   Pruritic rash    Involvement over her face, ears and chest. She never picked up the bactrim d/t cost so not sulfa allergy. No new make up, cleansers, detergents or soaps/lotions. No new foods.  May be d/t Biktarvy however she has not had it in 10 days and no changes in her rash.  ?Iris vs eosinophilic folliculitis vs eczema flare vs allergic reaction of unknown substance. Will treat with short course of prednisone. CD4 check today. Precautions discussed that would warrant ER evaluation.       Pain, dental    Swollen erythematous gumline on the left lower mandible. No palpable lymph nodes. She has mandibular swelling present and tenderness.   Will give 10-day course of amoxicillin and have her schedule urgently with dental team. Salt water rinses. Declined medicated mouth wash d/t cost.  I worry she is developing an abscess. No trismus present.  Will have her  call next week with an update as to her condition. ER precautions discussed.       HIV (human immunodeficiency virus infection) ([place]) - Primary    Continue Biktarvy cautiously to ensure no allergic reaction. Given she has been off of it for 10-days without any improvement I suspect something else.  Will check her VL today for therapeutic response. May still be viremic d/t lapse.  She was tearful today thinking she may die. Admits she is drinking a lot more but trying to cut down because of what we  talked about last time.  RTC in 6 weeks.       Relevant Medications   bictegravir-emtricitabine-tenofovir AF (BIKTARVY) 50-200-25 MG TABS tablet   Other Relevant Orders   HIV-1 RNA quant-no reflex-bld   T-helper cell (CD4)- ([facility] clinic only)   Alcohol use    She appears at least mildly impaired today.  Discussed and offered support to help reduce alcohol consumption. Will re-evaluate at next visit.       AIDS (acquired immune deficiency syndrome) ([place])   Relevant Medications   bictegravir-emtricitabine-tenofovir AF (BIKTARVY) 50-200-25 MG TABS tablet      Janene Madeira, MSN, NP-C [facility] [place] Pager: [phone] Office: [phone]  12/22/19  11:09 PM

## 2019-12-22 NOTE — Assessment & Plan Note (Addendum)
Involvement over her face, ears and chest. She never picked up the bactrim d/t cost so not sulfa allergy. No new make up, cleansers, detergents or soaps/lotions. No new foods.  May be d/t Biktarvy however she has not had it in 10 days and no changes in her rash.  ?Iris vs eosinophilic folliculitis vs eczema flare vs allergic reaction of unknown substance. Will treat with short course of prednisone. CD4 check today. Precautions discussed that would warrant ER evaluation.

## 2019-12-22 NOTE — Assessment & Plan Note (Signed)
Continue Biktarvy cautiously to ensure no allergic reaction. Given she has been off of it for 10-days without any improvement I suspect something else.  Will check her VL today for therapeutic response. May still be viremic d/t lapse.  She was tearful today thinking she may die. Admits she is drinking a lot more but trying to cut down because of what we talked about last time.  RTC in 6 weeks.

## 2019-12-22 NOTE — Patient Instructions (Addendum)
Please pick up all 3 prescriptions today from the pharmacy.   In the morning take your Biktarvy, Prednisone and first dose of antibiotic  At lunch take your second dose of antibiotic  At dinner take your third dose of antibiotic  For the prednisone take as instructed on the bottle.   Please call me next week with an update for your tooth.   Salt water rinses can be helpful for the pain also.

## 2019-12-22 NOTE — Assessment & Plan Note (Signed)
She appears at least mildly impaired today.  Discussed and offered support to help reduce alcohol consumption. Will re-evaluate at next visit.

## 2019-12-22 NOTE — Progress Notes (Signed)
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

## 2019-12-23 LAB — T-HELPER CELL (CD4) - (RCID CLINIC ONLY)
CD4 % Helper T Cell: 16 % — ABNORMAL LOW (ref 33–65)
CD4 T Cell Abs: 249 /uL — ABNORMAL LOW (ref 400–1790)

## 2019-12-24 ENCOUNTER — Telehealth: Payer: Self-pay

## 2019-12-24 ENCOUNTER — Encounter: Payer: Self-pay | Admitting: Infectious Diseases

## 2019-12-24 LAB — HIV-1 RNA QUANT-NO REFLEX-BLD
HIV 1 RNA Quant: 26900 Copies/mL — ABNORMAL HIGH
HIV-1 RNA Quant, Log: 4.43 Log cps/mL — ABNORMAL HIGH

## 2019-12-24 NOTE — Telephone Encounter (Signed)
Patient states she is having trouble obtaining her medication at the pharmacy. Patient has Biktarvy on hand (30 day supply). Call transferred to [facility] with financial team to follow up.  Valarie Cones

## 2019-12-30 ENCOUNTER — Encounter: Payer: Self-pay | Admitting: Infectious Diseases

## 2020-01-10 ENCOUNTER — Telehealth: Payer: Self-pay | Admitting: *Deleted

## 2020-01-10 NOTE — Telephone Encounter (Signed)
-----   Message from Blanchard Kelch, NP sent at 12/27/2019 10:58 AM EDT ----- Debra Wallace needs some TLC I think. She was tearful and struggling with some alcohol use. I can tell she has been taking her Biktarvy least a little with her CD4 coming up > 200, but her VL is still quite high. I believe she was out a month waiting on insurance.   Can you call her to let her know her CD4 and VL and get an update on how she is feeling? Also some special mamma-[name] encouragement :)

## 2020-01-10 NOTE — Telephone Encounter (Signed)
Spoke with patient. She has been feeling pretty well, although does have some hot/cold spells. She says she takes her Biktarvy every morning when she wakes up at 4:30 for work. She does not think she has missed a dose in the last 2 weeks. She was having some difficulty remembering the prednisone taper for her rash and the antibiotic for her dental infection. She said the rash is better, adds she has been using hydrocortisone cream for this as well. She says she will finish the antibiotic for her dental infection this week. She was encouraged by the CD4 result and feels her viral load will be under better control when she comes in for a visit 9/27. Confirmed upcoming appointment and asked her to call with any questions or problems. Andree Coss, RN

## 2020-01-21 ENCOUNTER — Telehealth: Payer: Self-pay

## 2020-01-21 NOTE — Telephone Encounter (Signed)
COVID-19 Pre-Screening Questions:01/21/20  Do you currently have a fever (>100 F), chills or unexplained body aches? NO  Are you currently experiencing new cough, shortness of breath, sore throat, runny nose? NO .  Have you been in contact with someone that is currently pending confirmation of Covid19 testing or has been confirmed to have the Covid19 virus? NO  **If the patient answers NO to ALL questions -  advise the patient to please call the clinic before coming to the office should any symptoms develop.

## 2020-01-24 ENCOUNTER — Telehealth (INDEPENDENT_AMBULATORY_CARE_PROVIDER_SITE_OTHER): Payer: Self-pay | Admitting: Infectious Diseases

## 2020-01-24 ENCOUNTER — Other Ambulatory Visit: Payer: Self-pay

## 2020-01-24 DIAGNOSIS — Z21 Asymptomatic human immunodeficiency virus [HIV] infection status: Secondary | ICD-10-CM

## 2020-01-24 DIAGNOSIS — L282 Other prurigo: Secondary | ICD-10-CM

## 2020-01-24 MED ORDER — DARUN-COBIC-EMTRICIT-TENOFAF 800-150-200-10 MG PO TABS: 1.0000 | ORAL_TABLET | Freq: Every day | ORAL | 5 refills | Status: DC

## 2020-01-24 MED ORDER — SULFAMETHOXAZOLE-TRIMETHOPRIM 400-80 MG PO TABS: 1.0000 | ORAL_TABLET | Freq: Every day | ORAL | 5 refills | Status: DC

## 2020-01-24 MED ORDER — HYDROXYZINE HCL 25 MG PO TABS
25.0000 mg | ORAL_TABLET | Freq: Three times a day (TID) | ORAL | 1 refills | Status: DC | PRN
Start: 2020-01-24 — End: 2020-05-09

## 2020-01-24 NOTE — Assessment & Plan Note (Signed)
We will have her come back to the office to meet with Cassie in a month after switching her medication from [place] to [place].  I asked her to please bring all her medication that she takes on a regular basis with her.  She needs some help with a pill tray and some coaching for adherence.  She verbalized to me a few times on the video today that she will stop taking Biktarvy and start taking the new large yellow pill.  She has chosen to take this with meals in the morning to help with her adherence. Return to clinic with Cassie in 1 month.  Appointment was made at the time of the visit and she is aware.

## 2020-01-24 NOTE — Progress Notes (Addendum)
Name: Debra Wallace  DOB: 07/20/1970 MRN: [id number] PCP: Patient, No Pcp Per   VIRTUAL CARE ENCOUNTER  I connected with Debra Wallace on 01/31/20 at  1:45 PM EDT by VIDEO and verified that I am speaking with the correct person using two identifiers.   I discussed the limitations, risks, security and privacy concerns of performing an evaluation and management service by virtual service and the availability of in person appointments. I also discussed with the patient that there may be a patient responsible charge related to this service. The patient expressed understanding and agreed to proceed.  Patient Location: [place] residence   Other Participants: none  Provider Location: [facility] Office    Patient Active Problem List   Diagnosis Date Noted  . Pruritic rash 12/22/2019  . AIDS (acquired immune deficiency syndrome) ([place]) 12/22/2019  . Alcohol use 12/22/2019  . Weight loss 11/29/2019  . Abnormal Pap smear of cervix 11/29/2019  . Healthcare maintenance 11/29/2019  . HIV (human immunodeficiency virus infection) ([place]) 11/23/2019     Brief Narrative:  Chalene Treu is a 49 y.o. female with HIV disease and AIDS, Dx at routine GYN care visit 08-2019 CD4 nadir 136 HIV Risk: heterosexual  History of OIs: none Intake Labs 08-2019: Hep B sAg (-), sAb (), cAb (); Hep A (), Hep C (-) Quantiferon () HLA B*5701 () G6PD: ()   Previous Regimens: . Biktarvy  Genotypes: . 08-2019: [id number]   Subjective:   Chief Complaint  Patient presents with  . Follow-up     HPI: Lokelani changed her visit today from in person to virtual for feeling unwell.  She is got watery eyes, fatigue, nasal congestion and low energy.  She feels like she has just a head cold.  Noticed this start this week after she was catching the bus in the rain.  She reports that her tooth pain has improved and resolved after antibiotic.  She is not sure if she had any improvement in the rash with prednisone or not.  Rash is still  present and quite itchy.  Bumpy all over the chest and face.   She also notices that her lips swell periodically and tongue goes numb.    She is not exactly sure which medication she is taking.    Review of Systems  Constitutional: Negative for chills, fever, malaise/fatigue and weight loss.  HENT: Negative for sore throat.        Dental infection cleared   Respiratory: Negative for cough and sputum production.   Cardiovascular: Negative for chest pain and leg swelling.  Gastrointestinal: Negative for abdominal pain, diarrhea and vomiting.  Genitourinary: Negative for dysuria and flank pain.  Musculoskeletal: Negative for joint pain, myalgias and neck pain.  Skin: Positive for itching and rash.  Neurological: Negative for dizziness, tingling and headaches.  Psychiatric/Behavioral: Negative for depression and substance abuse. The patient is not nervous/anxious and does not have insomnia.     Past Medical History:  Diagnosis Date  . HIV (human immunodeficiency virus infection) ([place]) 11/23/2019   Dx 09/2019 CD4 nadir 136  . Hypertension     Outpatient Medications Prior to Visit  Medication Sig Dispense Refill  . bictegravir-emtricitabine-tenofovir AF (BIKTARVY) 50-200-25 MG TABS tablet Take 1 tablet by mouth daily. 30 tablet 11  . sulfamethoxazole-trimethoprim (BACTRIM) 400-80 MG tablet Take 1 tablet by mouth daily. 30 tablet 5  . diclofenac sodium (VOLTAREN) 1 % GEL Apply 1 application topically 4 (four) times daily. (Patient not taking: Reported on 12/22/2019) 100  g 0  . bictegravir-emtricitabine-tenofovir AF (BIKTARVY) 50-200-25 MG TABS tablet Take 1 tablet by mouth daily. (Patient not taking: Reported on 12/22/2019) 30 tablet 5  . metroNIDAZOLE (FLAGYL) 500 MG tablet Take 1 tablet (500 mg total) by mouth 2 (two) times daily. (Patient not taking: Reported on 01/24/2020) 14 tablet 0  . predniSONE (STERAPRED UNI-PAK 21 TAB) 10 MG (21) TBPK tablet Take 4 pills daily for two days; then take 3  pills daily for two days, then 2 pills daily for two days, then one pill daily until gone (Patient not taking: Reported on 01/24/2020) 21 tablet 0  . tinidazole (TINDAMAX) 500 MG tablet Take 2 tablets (1,000 mg total) by mouth daily with breakfast. (Patient not taking: Reported on 12/22/2019) 10 tablet 2   No facility-administered medications prior to visit.     No Known Allergies  Social History   Tobacco Use  . Smoking status: Current Every Day Smoker    Packs/day: 1.00  . Smokeless tobacco: Never Used  Substance Use Topics  . Alcohol use: Yes    Comment: occ  . Drug use: No    Family History  Problem Relation Age of Onset  . Hypertension Mother   . Diabetes Father     Social History   Substance and Sexual Activity  Sexual Activity Not Currently  . Partners: Male  . Birth control/protection: Condom     Objective:   There were no vitals filed for this visit. There is no height or weight on file to calculate BMI.  Physical Exam Constitutional:      Appearance: Normal appearance. She is not ill-appearing.  HENT:     Mouth/Throat:     Mouth: Mucous membranes are moist.     Pharynx: Oropharynx is clear.  Eyes:     General: No scleral icterus. Pulmonary:     Effort: Pulmonary effort is normal.  Skin:    Findings: Rash present.     Comments: She has a diffuse rash that is raised and dark bumpy along her chest and face.  Appears very itchy.  No swelling in the lips that I can tell today.  Neurological:     Mental Status: She is oriented to person, place, and time.  Psychiatric:        Mood and Affect: Mood normal.        Thought Content: Thought content normal.     Lab Results Lab Results  Component Value Date   WBC 3.0 (L) 10/11/2019   HGB 11.5 (L) 10/11/2019   HCT 33.9 (L) 10/11/2019   MCV 93.9 10/11/2019   PLT 214 10/11/2019    Lab Results  Component Value Date   CREATININE 0.46 (L) 10/11/2019   BUN 5 (L) 10/11/2019   NA 136 10/11/2019   K 3.9  10/11/2019   CL 103 10/11/2019   CO2 25 10/11/2019    Lab Results  Component Value Date   ALT 37 (H) 10/11/2019   AST 44 (H) 10/11/2019   ALKPHOS 279 (H) 09/22/2019   BILITOT 0.5 10/11/2019    No results found for: CHOL, HDL, LDLCALC, LDLDIRECT, TRIG, CHOLHDL HIV 1 RNA Quant  Date Value  12/22/2019 26,900 Copies/mL (H)  10/11/2019 140,000 copies/mL (H)   CD4 T Cell Abs (/uL)  Date Value  12/22/2019 249 (L)  10/11/2019 136 (L)     Assessment & Plan:   Problem List Items Addressed This Visit      Unprioritized   Pruritic rash    Given  the distribution pattern of her rash 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  Pretty common for folks with advanced HIV.  I discussed with her that the only thing that concerns me is the swelling of her lips.  We will change her to once daily Symtuza for consideration of possible delayed reaction to bictegravir component.  I think this is less likely.  I encouraged her to continue her antiretroviral treatment.  We will also call in hydroxyzine for her to use 3 times daily to help manage the itching.      HIV (human immunodeficiency virus infection) ([place])    We will have her come back to the office to meet with Cassie in a month after switching her medication from [place] to [place].  I asked her to please bring all her medication that she takes on a regular basis with her.  She needs some help with a pill tray and some coaching for adherence.  She verbalized to me a few times on the video today that she will stop taking Biktarvy and start taking the new large yellow pill.  She has chosen to take this with meals in the morning to help with her adherence. Return to clinic with Cassie in 1 month.  Appointment was made at the time of the visit and she is aware.      Relevant Medications   Darunavir-Cobicisctat-Emtricitabine-Tenofovir Alafenamide (SYMTUZA) 800-150-200-10 MG TABS   sulfamethoxazole-trimethoprim (BACTRIM) 400-80 MG  tablet      Janene Madeira, MSN, NP-C [facility] for Infectious [place] Pager: [phone] Office: [phone]  01/24/20  3:51 PM

## 2020-01-24 NOTE — Assessment & Plan Note (Signed)
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  Pretty common for folks with advanced HIV.  I discussed with her that the only thing that concerns me is the swelling of her lips.  We will change her to once daily Symtuza for consideration of possible delayed reaction to bictegravir component.  I think this is less likely.  I encouraged her to continue her antiretroviral treatment.  We will also call in hydroxyzine for her to use 3 times daily to help manage the itching.

## 2020-02-22 ENCOUNTER — Other Ambulatory Visit: Payer: Self-pay | Admitting: Infectious Diseases

## 2020-02-22 ENCOUNTER — Other Ambulatory Visit: Payer: Self-pay

## 2020-02-22 ENCOUNTER — Ambulatory Visit (INDEPENDENT_AMBULATORY_CARE_PROVIDER_SITE_OTHER): Payer: Self-pay | Admitting: Pharmacist

## 2020-02-22 DIAGNOSIS — L282 Other prurigo: Secondary | ICD-10-CM

## 2020-02-22 DIAGNOSIS — Z21 Asymptomatic human immunodeficiency virus [HIV] infection status: Secondary | ICD-10-CM

## 2020-02-22 DIAGNOSIS — I10 Essential (primary) hypertension: Secondary | ICD-10-CM

## 2020-02-22 DIAGNOSIS — Z79899 Other long term (current) drug therapy: Secondary | ICD-10-CM

## 2020-02-22 MED ORDER — VALSARTAN 40 MG PO TABS: 40.0000 mg | ORAL_TABLET | Freq: Every day | ORAL | 2 refills | Status: DC

## 2020-02-22 MED ORDER — PREDNISONE 10 MG (21) PO TBPK: ORAL_TABLET | ORAL | 0 refills | Status: AC

## 2020-02-22 NOTE — Progress Notes (Signed)
HPI: Debra Wallace is a 49 y.o. female who presents to the [facility] pharmacy clinic for HIV follow-up.  Patient Active Problem List   Diagnosis Date Noted  . Pruritic rash 12/22/2019  . AIDS (acquired immune deficiency syndrome) ([facility]) 12/22/2019  . Alcohol use 12/22/2019  . Weight loss 11/29/2019  . Abnormal Pap smear of cervix 11/29/2019  . Healthcare maintenance 11/29/2019  . HIV (human immunodeficiency virus infection) ([facility]) 11/23/2019    Patient's Medications  New Prescriptions   No medications on file  Previous Medications   DARUNAVIR-COBICISCTAT-EMTRICITABINE-TENOFOVIR ALAFENAMIDE (SYMTUZA) 800-150-200-10 MG TABS    Take 1 tablet by mouth daily with breakfast.   DICLOFENAC SODIUM (VOLTAREN) 1 % GEL    Apply 1 application topically 4 (four) times daily.   HYDROXYZINE (ATARAX/VISTARIL) 25 MG TABLET    Take 1 tablet (25 mg total) by mouth 3 (three) times daily as needed.   SULFAMETHOXAZOLE-TRIMETHOPRIM (BACTRIM) 400-80 MG TABLET    Take 1 tablet by mouth daily.  Modified Medications   No medications on file  Discontinued Medications   No medications on file    Allergies: No Known Allergies  Past Medical History: Past Medical History:  Diagnosis Date  . HIV (human immunodeficiency virus infection) ([facility]) 11/23/2019   Dx 09/2019 CD4 nadir 136  . Hypertension     Social History: Social History   Socioeconomic History  . Marital status: Single    Spouse name: Not on file  . Number of children: Not on file  . Years of education: Not on file  . Highest education level: Not on file  Occupational History  . Not on file  Tobacco Use  . Smoking status: Current Every Day Smoker    Packs/day: 1.00  . Smokeless tobacco: Never Used  Substance and Sexual Activity  . Alcohol use: Yes    Comment: occ  . Drug use: No  . Sexual activity: Not Currently    Partners: Male    Birth control/protection: Condom  Other Topics Concern  . Not on file  Social History Narrative  . Not on  file   Social Determinants of Health   Financial Resource Strain:   . Difficulty of Paying Living Expenses: Not on file  Food Insecurity:   . Worried About Programme researcher, broadcasting/film/video in the Last Year: Not on file  . Ran Out of Food in the Last Year: Not on file  Transportation Needs:   . Lack of Transportation (Medical): Not on file  . Lack of Transportation (Non-Medical): Not on file  Physical Activity:   . Days of Exercise per Week: Not on file  . Minutes of Exercise per Session: Not on file  Stress:   . Feeling of Stress : Not on file  Social Connections:   . Frequency of Communication with Friends and Family: Not on file  . Frequency of Social Gatherings with Friends and Family: Not on file  . Attends Religious Services: Not on file  . Active Member of Clubs or Organizations: Not on file  . Attends Banker Meetings: Not on file  . Marital Status: Not on file    Labs: Lab Results  Component Value Date   HIV1RNAQUANT 26,900 (H) 12/22/2019   HIV1RNAQUANT 140,000 (H) 10/11/2019   CD4TABS 249 (L) 12/22/2019   CD4TABS 136 (L) 10/11/2019    RPR and STI Lab Results  Component Value Date   LABRPR Non Reactive 09/22/2019    STI Results GC CT  09/22/2019 Negative Negative  Hepatitis B Lab Results  Component Value Date   HEPBSAG Negative 09/22/2019   Hepatitis C No results found for: HEPCAB, HCVRNAPCRQN Hepatitis A No results found for: HAV Lipids: No results found for: CHOL, TRIG, HDL, CHOLHDL, VLDL, LDLCALC  Current HIV Regimen: Symtuza  Assessment: Debra Wallace is here today to follow up for her HIV infection after switching medications. She saw Debra Wallace via video visit on 9/27 and was switched from [place] to [place] due to a rash that she developed. 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   She has been taking Symtuza now for about 4 weeks. Her rash has not improved and is covering her whole body, including her face.  She did not pick up the  hydroxyzine and states that she took the steroid pack Debra Wallace prescribed for her. She admits to not taking her Biktarvy like prescribed before the switch, which is evident by her viral load of 26,900. I offered to fill a pill box for her or show her how but she declined. She states that she has tightened up her adherence and has only missed one dose since starting Symtuza.  She takes it every day around the same time with breakfast. No side effects noted.  She is asking about her blood pressure medication, valsartan. She states her BP runs high. I checked it in clinic and it was 159/117, HR 90. I will start her on low dose valsartan and titrate up as needed.  Debra Wallace will send in another prednisone pack, and I will have her follow up in about 4 weeks to see Debra Wallace again.   Plan: - Continue Symtuza daily with food - Restart prednisone - F/u with Debra Wallace 11/23 at 845am  Daron Breeding L. Zana Biancardi, PharmD, BCIDP, AAHIVP, CPP Clinical Pharmacist Practitioner Infectious Diseases Clinical Pharmacist [facility] 02/22/2020, 9:38 AM

## 2020-02-22 NOTE — Progress Notes (Signed)
Rash no better since switching to Symtuza. 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  Will re-treat with longer course of prednisone and have her fill the Hydroxyzine for help.   May need to send her to dermatology - will follow up at return visit in 4 weeks.    Rexene Alberts, MSN, NP-C [facility] for Infectious Disease [facility]  [email or web address] Pager: [phone] Office: [phone] [facility] Main Line: [phone]

## 2020-02-23 LAB — T-HELPER CELL (CD4) - (RCID CLINIC ONLY)
CD4 % Helper T Cell: 22 % — ABNORMAL LOW (ref 33–65)
CD4 T Cell Abs: 249 /uL — ABNORMAL LOW (ref 400–1790)

## 2020-02-25 LAB — COMPREHENSIVE METABOLIC PANEL
AG Ratio: 0.8 (calc) — ABNORMAL LOW (ref 1.0–2.5)
ALT: 20 U/L (ref 6–29)
AST: 58 U/L — ABNORMAL HIGH (ref 10–35)
Albumin: 3.9 g/dL (ref 3.6–5.1)
Alkaline phosphatase (APISO): 95 U/L (ref 31–125)
BUN: 7 mg/dL (ref 7–25)
CO2: 23 mmol/L (ref 20–32)
Calcium: 9.5 mg/dL (ref 8.6–10.2)
Chloride: 99 mmol/L (ref 98–110)
Creat: 0.63 mg/dL (ref 0.50–1.10)
Globulin: 5.1 g/dL (calc) — ABNORMAL HIGH (ref 1.9–3.7)
Glucose, Bld: 82 mg/dL (ref 65–99)
Potassium: 4.2 mmol/L (ref 3.5–5.3)
Sodium: 133 mmol/L — ABNORMAL LOW (ref 135–146)
Total Bilirubin: 0.6 mg/dL (ref 0.2–1.2)
Total Protein: 9 g/dL — ABNORMAL HIGH (ref 6.1–8.1)

## 2020-02-25 LAB — HIV-1 RNA QUANT-NO REFLEX-BLD
HIV 1 RNA Quant: 69 Copies/mL — ABNORMAL HIGH
HIV-1 RNA Quant, Log: 1.84 Log cps/mL — ABNORMAL HIGH

## 2020-02-25 LAB — CBC
HCT: 39.6 % (ref 35.0–45.0)
Hemoglobin: 13.6 g/dL (ref 11.7–15.5)
MCH: 34.2 pg — ABNORMAL HIGH (ref 27.0–33.0)
MCHC: 34.3 g/dL (ref 32.0–36.0)
MCV: 99.5 fL (ref 80.0–100.0)
MPV: 8.5 fL (ref 7.5–12.5)
Platelets: 233 10*3/uL (ref 140–400)
RBC: 3.98 10*6/uL (ref 3.80–5.10)
RDW: 12.9 % (ref 11.0–15.0)
WBC: 2.1 10*3/uL — ABNORMAL LOW (ref 3.8–10.8)

## 2020-03-13 ENCOUNTER — Telehealth: Payer: Self-pay

## 2020-03-13 NOTE — Telephone Encounter (Signed)
Patient requesting refills for prednisone. Patient was recently prescribed this medication due to pruritic rash. Patient states the prednisone did help and rash has improved. Advised patient that this was a temporary medication for the rash and NP will follow up with patient at upcoming appointment to determine if patient will continue medication. Patient verbalized understanding.  Valarie Cones

## 2020-03-14 NOTE — Telephone Encounter (Signed)
Patient had refills on file with the pharmacy for hydroxyzine. Left patient a voicemail with updated information and to call our office if needed. Valarie Cones

## 2020-03-14 NOTE — Telephone Encounter (Signed)
I am glad it helped her. I would encourage her to just use the hydroxyzine TID PRN now until out next meeting. Hopefully she filled that too

## 2020-03-21 ENCOUNTER — Other Ambulatory Visit: Payer: Self-pay

## 2020-03-21 ENCOUNTER — Encounter: Payer: Self-pay | Admitting: Infectious Diseases

## 2020-03-21 ENCOUNTER — Ambulatory Visit (INDEPENDENT_AMBULATORY_CARE_PROVIDER_SITE_OTHER): Payer: Self-pay | Admitting: Infectious Diseases

## 2020-03-21 VITALS — BP 156/110 | HR 91 | Temp 97.6°F | Wt 147.0 lb

## 2020-03-21 DIAGNOSIS — Z7289 Other problems related to lifestyle: Secondary | ICD-10-CM

## 2020-03-21 DIAGNOSIS — B20 Human immunodeficiency virus [HIV] disease: Secondary | ICD-10-CM

## 2020-03-21 DIAGNOSIS — I10 Essential (primary) hypertension: Secondary | ICD-10-CM

## 2020-03-21 DIAGNOSIS — L282 Other prurigo: Secondary | ICD-10-CM

## 2020-03-21 DIAGNOSIS — Z21 Asymptomatic human immunodeficiency virus [HIV] infection status: Secondary | ICD-10-CM

## 2020-03-21 DIAGNOSIS — Z23 Encounter for immunization: Secondary | ICD-10-CM

## 2020-03-21 DIAGNOSIS — R634 Abnormal weight loss: Secondary | ICD-10-CM

## 2020-03-21 DIAGNOSIS — Z789 Other specified health status: Secondary | ICD-10-CM

## 2020-03-21 NOTE — Assessment & Plan Note (Signed)
She has reconstituted CD4 > 200 pretty quickly with constantly applied antivirals. Can stop bactrim now. Will start pneumonia vaccines today. She declined the flu shot. Eligible for Pfizer booster now - she can schedule here at our Friday vaccine clinic.  Her viral load has reduced nicely to 79 copies. We discussed current and historical labs. She is doing well. Will have her return in 2 months to follow up on VL and blood pressure again. VL, CD4 and BMP next visit.

## 2020-03-21 NOTE — Assessment & Plan Note (Signed)
Had some improvement on prednisone. 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 Has not picked up the hydroxyzine yet. Would like to limit prednisone for her as she is on Symtuza. Will have her try topical OTC hydrocortisone cream to the itchy spots, moisturize and use hydroxyzine PO TID PRN. She will pick up today.

## 2020-03-21 NOTE — Patient Instructions (Addendum)
Please continue the Symtuza (large yellow pill) once a day with food  STOP your bactrim (white antibiotic pill)  Pick up the Hydroxyzine to start as needed up to 3 times a day - this will hopefully help control the itching while your immune system is leveling out.  You can use over the counter hydrocortisone cream on the very itchy spots.   Continue the Diovan for your blood pressure - we may need to increase the dose. Continue to try to reduce your alcohol and cigarette use. You seem to be doing a great job! Proud of you   Please schedule your COVID booster shot on Friday.   We gave you your pneumonia vaccine first dose - we will get your second dose at you next visit in 2 months.   Labs prior to your visit if you can, please.

## 2020-03-21 NOTE — Progress Notes (Addendum)
Name: Debra Wallace  DOB: December 29, 1970 MRN: [id number] PCP: Patient, No Pcp Per     Patient Active Problem List   Diagnosis Date Noted  . Hypertension 03/21/2020  . Pruritic rash 12/22/2019  . Alcohol use 12/22/2019  . Weight loss 11/29/2019  . Abnormal Pap smear of cervix 11/29/2019  . Healthcare maintenance 11/29/2019  . HIV (human immunodeficiency virus infection) ([place]) 11/23/2019     Brief Narrative:  Debra Wallace is a 49 y.o. female with HIV disease and AIDS, Dx at routine GYN care visit 08-2019 CD4 nadir 136 HIV Risk: heterosexual  History of OIs: none Intake Labs 08-2019: Hep B sAg (-), sAb (), cAb (); Hep A (), Hep C (-) Quantiferon () HLA B*5701 () G6PD: ()   Previous Regimens: . Biktarvy (worried about a rash)  . Symtuza   Genotypes: . 08-2019: [id number]   Subjective:   Chief Complaint  Patient presents with  . Follow-up      HPI: Chaz has been doing pretty well since our last visit. She works early morning shift at [employer] and ready for bed today. Has done very well on Symtuza once a day. No concerns for side effects at this time. She has not missed any doses she can recall. Has stopped the Bactrim as it "was done."   Still with ongoing pruritic rash to face and chest. She had some improvement on the prednsione. Has not picked up the Hydroxyzine yet.   Has started the Diovan but has not taken it today yet. Smoked a cigarette before she came to the office also. Alcohol intake is way down from when we first started working together.   Review of Systems  Constitutional: Negative for appetite change, chills, fatigue, fever and unexpected weight change.  HENT: Negative for mouth sores, sore throat and trouble swallowing.   Eyes: Negative for pain and visual disturbance.  Respiratory: Negative for cough and shortness of breath.   Cardiovascular: Negative for chest pain.  Gastrointestinal: Negative for abdominal pain, diarrhea and nausea.  Genitourinary:  Negative for dysuria, menstrual problem and pelvic pain.  Musculoskeletal: Negative for back pain and neck pain.  Skin: Positive for rash. Negative for color change.  Neurological: Negative for weakness, numbness and headaches.  Hematological: Negative for adenopathy.  Psychiatric/Behavioral: Negative for dysphoric mood. The patient is not nervous/anxious.      Past Medical History:  Diagnosis Date  . HIV (human immunodeficiency virus infection) ([place]) 11/23/2019   Dx 09/2019 CD4 nadir 136  . Hypertension     Outpatient Medications Prior to Visit  Medication Sig Dispense Refill  . Darunavir-Cobicisctat-Emtricitabine-Tenofovir Alafenamide (SYMTUZA) 800-150-200-10 MG TABS Take 1 tablet by mouth daily with breakfast. 30 tablet 5  . hydrOXYzine (ATARAX/VISTARIL) 25 MG tablet Take 1 tablet (25 mg total) by mouth 3 (three) times daily as needed. 60 tablet 1  . valsartan (DIOVAN) 40 MG tablet Take 1 tablet (40 mg total) by mouth daily. 30 tablet 2  . diclofenac sodium (VOLTAREN) 1 % GEL Apply 1 application topically 4 (four) times daily. (Patient not taking: Reported on 12/22/2019) 100 g 0  . sulfamethoxazole-trimethoprim (BACTRIM) 400-80 MG tablet Take 1 tablet by mouth daily. (Patient not taking: Reported on 03/21/2020) 30 tablet 5   No facility-administered medications prior to visit.     No Known Allergies  Social History   Tobacco Use  . Smoking status: Current Every Day Smoker    Packs/day: 1.00  . Smokeless tobacco: Never Used  Substance Use Topics  .  Alcohol use: Yes    Comment: occ  . Drug use: No    Family History  Problem Relation Age of Onset  . Hypertension Mother   . Diabetes Father     Social History   Substance and Sexual Activity  Sexual Activity Not Currently  . Partners: Male  . Birth control/protection: Condom     Objective:   Vitals:   03/21/20 0832  BP: (!) 156/110  Pulse: 91  Temp: 97.6 F (36.4 C)  TempSrc: Oral  Weight: 147 lb (66.7 kg)    Body mass index is 24.46 kg/m.   Physical Exam Constitutional:      Appearance: Normal appearance. She is not ill-appearing.  HENT:     Mouth/Throat:     Mouth: Mucous membranes are moist.     Pharynx: Oropharynx is clear.  Eyes:     General: No scleral icterus. Pulmonary:     Effort: Pulmonary effort is normal.  Skin:    Findings: Rash present.     Comments: Same quality raised bumpy rash to face and chest. Some open areas where she has picked at it. Nothing appears acutely infected. No drainage.   Neurological:     Mental Status: She is oriented to person, place, and time.  Psychiatric:        Mood and Affect: Mood normal.        Thought Content: Thought content normal.     Lab Results Lab Results  Component Value Date   WBC 2.1 (L) 02/22/2020   HGB 13.6 02/22/2020   HCT 39.6 02/22/2020   MCV 99.5 02/22/2020   PLT 233 02/22/2020    Lab Results  Component Value Date   CREATININE 0.63 02/22/2020   BUN 7 02/22/2020   NA 133 (L) 02/22/2020   K 4.2 02/22/2020   CL 99 02/22/2020   CO2 23 02/22/2020    Lab Results  Component Value Date   ALT 20 02/22/2020   AST 58 (H) 02/22/2020   ALKPHOS 279 (H) 09/22/2019   BILITOT 0.6 02/22/2020    No results found for: CHOL, HDL, LDLCALC, LDLDIRECT, TRIG, CHOLHDL HIV 1 RNA Quant  Date Value  02/22/2020 69 Copies/mL (H)  12/22/2019 26,900 Copies/mL (H)  10/11/2019 140,000 copies/mL (H)   CD4 T Cell Abs (/uL)  Date Value  02/22/2020 249 (L)  12/22/2019 249 (L)  10/11/2019 136 (L)     Assessment & Plan:   Problem List Items Addressed This Visit      Unprioritized   Weight loss   Pruritic rash    Had some improvement on prednisone. 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 Has not picked up the hydroxyzine yet. Would like to limit prednisone for her as she is on Symtuza. Will have her try topical OTC hydrocortisone cream to the itchy spots, moisturize and use hydroxyzine PO TID PRN. She will pick up today.        Hypertension    BP Readings from Last 3 Encounters:  03/21/20 (!) 156/110  12/22/19 (!) 138/103  11/23/19 (!) 142/101   Uncontrolled but has not taken BP meds today and smoked. Unable to monitor at home. Will have her back in 2 months with meds > 1hr before visit and no cigarettes before arrival to help with titration of Diovan. May need adjunctive diuretic. Discussed smoking and alcohol cessation.       Relevant Orders   Basic metabolic panel   HIV (human immunodeficiency virus infection) ([place])    She has reconstituted CD4 >  200 pretty quickly with constantly applied antivirals. Can stop bactrim now. Will start pneumonia vaccines today. She declined the flu shot. Eligible for [place] booster now - she can schedule here at our Friday vaccine clinic.  Her viral load has reduced nicely to 79 copies. We discussed current and historical labs. She is doing well. Will have her return in 2 months to follow up on VL and blood pressure again. VL, CD4 and BMP next visit.       Alcohol use    Improving - discussed again today for complete cessation if she can given difficulties with BP.        Other Visit Diagnoses    Asymptomatic HIV infection ([place])    -  Primary   Relevant Orders   T-helper cell (CD4)- ([facility] clinic only)   HIV-1 RNA quant-no reflex-bld   Pneumococcal conjugate vaccine 13-valent IM (Completed)   Need for vaccination with 13-polyvalent pneumococcal conjugate vaccine       Relevant Orders   Pneumococcal conjugate vaccine 13-valent IM (Completed)      Janene Madeira, MSN, NP-C [facility] [place] Pager: [phone] Office: [phone]  03/21/20  10:14 AM

## 2020-03-21 NOTE — Assessment & Plan Note (Signed)
Improving - discussed again today for complete cessation if she can given difficulties with BP.

## 2020-03-21 NOTE — Assessment & Plan Note (Signed)
BP Readings from Last 3 Encounters:  03/21/20 (!) 156/110  12/22/19 (!) 138/103  11/23/19 (!) 142/101   Uncontrolled but has not taken BP meds today and smoked. Unable to monitor at home. Will have her back in 2 months with meds > 1hr before visit and no cigarettes before arrival to help with titration of Diovan. May need adjunctive diuretic. Discussed smoking and alcohol cessation.

## 2020-05-09 ENCOUNTER — Ambulatory Visit: Payer: Self-pay

## 2020-05-09 ENCOUNTER — Other Ambulatory Visit: Payer: Self-pay

## 2020-05-09 ENCOUNTER — Ambulatory Visit (INDEPENDENT_AMBULATORY_CARE_PROVIDER_SITE_OTHER): Payer: Self-pay | Admitting: Infectious Diseases

## 2020-05-09 ENCOUNTER — Encounter: Payer: Self-pay | Admitting: Infectious Diseases

## 2020-05-09 VITALS — BP 147/99 | HR 88 | Temp 97.4°F | Wt 150.0 lb

## 2020-05-09 DIAGNOSIS — Z21 Asymptomatic human immunodeficiency virus [HIV] infection status: Secondary | ICD-10-CM

## 2020-05-09 DIAGNOSIS — I10 Essential (primary) hypertension: Secondary | ICD-10-CM

## 2020-05-09 DIAGNOSIS — B20 Human immunodeficiency virus [HIV] disease: Secondary | ICD-10-CM

## 2020-05-09 DIAGNOSIS — R634 Abnormal weight loss: Secondary | ICD-10-CM

## 2020-05-09 DIAGNOSIS — Z23 Encounter for immunization: Secondary | ICD-10-CM

## 2020-05-09 DIAGNOSIS — L282 Other prurigo: Secondary | ICD-10-CM

## 2020-05-09 MED ORDER — HYDROXYZINE HCL 25 MG PO TABS: 25.0000 mg | ORAL_TABLET | Freq: Three times a day (TID) | ORAL | 3 refills | Status: DC | PRN

## 2020-05-09 NOTE — Patient Instructions (Addendum)
Please continue your Symtuza everyday with food as you have been doing.   Very happy how well you look, feel and the blood work improvements!  For the hydroxyzine - I sent this to [facility], should be cheaper for 30 pills for you. Try to take this just as needed now that your skin has improved. If you can get by with less that's great! You have refills if needed.   We gave you your second pneumonia vaccine and first meningitis shot. Will give the second dose at your next visit.   Will see you back in 4 months with labs before if you can so they are ready at our visit.

## 2020-05-09 NOTE — Assessment & Plan Note (Signed)
Resolved - regained weight with improvement in overall condition.

## 2020-05-09 NOTE — Assessment & Plan Note (Signed)
Reviewed labs and CD4 trends. Will update today including hepatitis serologies to determine further vaccination needs. Quantiferon today and HLA.  Continue symtuza once daily. No further need for bactrim prophylaxis.  RTC in 24mwith labs prior to.

## 2020-05-09 NOTE — Assessment & Plan Note (Signed)
Improved with immune system reconstitution. Refill hydroxyzine and take prn now

## 2020-05-09 NOTE — Addendum Note (Signed)
Addended by: Tressa Busman T on: 05/09/2020 11:22 AM   Modules accepted: Orders

## 2020-05-09 NOTE — Progress Notes (Signed)
Name: Debra Wallace  DOB: [birth year] MRN: [id number] PCP: Patient, No Pcp Per     Patient Active Problem List   Diagnosis Date Noted  . Hypertension 03/21/2020  . Pruritic rash 12/22/2019  . Alcohol use 12/22/2019  . Abnormal Pap smear of cervix 11/29/2019  . Healthcare maintenance 11/29/2019  . HIV (human immunodeficiency virus infection) ([place]) 11/23/2019     Brief Narrative:  Debra Wallace is a 50 y.o. female with HIV disease and AIDS, Dx at routine GYN care visit 08-2019 CD4 nadir 136 HIV Risk: heterosexual  History of OIs: none Intake Labs 08-2019: Hep B sAg (-), sAb (), cAb (); Hep A (), Hep C (-) Quantiferon () HLA B*5701 () G6PD: ()   Previous Regimens: . Biktarvy (worried about a rash)  . Symtuza   Genotypes: . 08-2019: [id number]   Subjective:   Chief Complaint  Patient presents with  . Follow-up    B20      HPI: Debra Wallace is doing well today. Feels that her energy is better, has regained some weight she had previously lost (25#) and her skin is finally starting to calm down. Using hydroxyzine TID and needs a cheaper option - prescription was not paid for the last time she picked it up and too expensive.   Has not had booster yet for COVID - initially vaccinated with Moderna > 52mago. Has an appointment next week for this. 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 Does not want the flu shot - made her very sick    Review of Systems  Constitutional: Negative for chills and fever.  HENT: Negative for sore throat.        No dental problems  Respiratory: Negative for cough.   Cardiovascular: Negative for chest pain and leg swelling.  Gastrointestinal: Negative for abdominal pain, diarrhea and vomiting.  Genitourinary: Negative for dysuria and flank pain.  Musculoskeletal: Negative for myalgias and neck pain.  Skin: Negative for rash.  Neurological: Negative for dizziness and headaches.  Psychiatric/Behavioral: The patient is not  nervous/anxious.      Past Medical History:  Diagnosis Date  . [place] virus infection) [place] 11/23/2019   Dx 09/2019 CD4 nadir 136  . Hypertension     Outpatient Medications Prior to Visit  Medication Sig Dispense Refill  . Darunavir-Cobicisctat-Emtricitabine-Tenofovir Alafenamide (SYMTUZA) 800-150-200-10 MG TABS Take 1 tablet by mouth daily with breakfast. 30 tablet 5  . valsartan (DIOVAN) 40 MG tablet Take 1 tablet (40 mg total) by mouth daily. 30 tablet 2  . hydrOXYzine (ATARAX/VISTARIL) 25 MG tablet Take 1 tablet (25 mg total) by mouth 3 (three) times daily as needed. 60 tablet 1  . diclofenac sodium (VOLTAREN) 1 % GEL Apply 1 application topically 4 (four) times daily. (Patient not taking: No sig reported) 100 g 0  . sulfamethoxazole-trimethoprim (BACTRIM) 400-80 MG tablet Take 1 tablet by mouth daily. (Patient not taking: Reported on 05/09/2020) 30 tablet 5   No facility-administered medications prior to visit.     No Known Allergies  Social History   Tobacco Use  . Smoking status: Current Every Day Smoker    Packs/day: 1.00  . Smokeless tobacco: Never Used  Substance Use Topics  . Alcohol use: Yes    Comment: occ  . Drug use: No    Social History   Substance and Sexual Activity  Sexual Activity Not Currently  . Partners: Male  . Birth control/protection: Condom   Comment: declined  condoms 05/09/20     Objective:   Vitals:   05/09/20 1033  BP: (!) 147/99  Pulse: 88  Temp: (!) 97.4 F (36.3 C)  TempSrc: Temporal  Weight: 150 lb (68 kg)   Body mass index is 24.96 kg/m.   Physical Exam HENT:     Mouth/Throat:     Mouth: No oral lesions.     Dentition: No dental abscesses.  Cardiovascular:     Rate and Rhythm: Normal rate and regular rhythm.     Heart sounds: Normal heart sounds.  Pulmonary:     Effort: Pulmonary effort is normal.     Breath sounds: Normal breath sounds.  Abdominal:     General: There is no distension.      Palpations: Abdomen is soft.     Tenderness: There is no abdominal tenderness.  Musculoskeletal:        General: No tenderness. Normal range of motion.  Lymphadenopathy:     Cervical: No cervical adenopathy.  Skin:    General: Skin is warm and dry.     Findings: No rash.     Comments: Skin on face is better, smoother and less itchy. Still some hyperpigmented spots but much better than months before.   Neurological:     Mental Status: She is alert and oriented to person, place, and time.  Psychiatric:        Judgment: Judgment normal.     Lab Results Lab Results  Component Value Date   WBC 2.1 (L) 02/22/2020   HGB 13.6 02/22/2020   HCT 39.6 02/22/2020   MCV 99.5 02/22/2020   PLT 233 02/22/2020    Lab Results  Component Value Date   CREATININE 0.63 02/22/2020   BUN 7 02/22/2020   NA 133 (L) 02/22/2020   K 4.2 02/22/2020   CL 99 02/22/2020   CO2 23 02/22/2020    Lab Results  Component Value Date   ALT 20 02/22/2020   AST 58 (H) 02/22/2020   ALKPHOS 279 (H) 09/22/2019   BILITOT 0.6 02/22/2020    No results found for: CHOL, HDL, LDLCALC, LDLDIRECT, TRIG, CHOLHDL HIV 1 RNA Quant  Date Value  02/22/2020 69 Copies/mL (H)  12/22/2019 26,900 Copies/mL (H)  10/11/2019 140,000 copies/mL (H)   CD4 T Cell Abs (/uL)  Date Value  02/22/2020 249 (L)  12/22/2019 249 (L)  10/11/2019 136 (L)     Assessment & Plan:   Problem List Items Addressed This Visit      Unprioritized   RESOLVED: Weight loss    Resolved - regained weight with improvement in overall condition.       Pruritic rash    Improved with immune system reconstitution. Refill hydroxyzine and take prn now      Hypertension    BP Readings from Last 3 Encounters:  05/09/20 (!) 147/99  03/21/20 (!) 156/110  12/22/19 (!) 138/103   Getting better with resuming Diovan - may need up titration. Will follow next visit.       HIV (human immunodeficiency virus infection) ([place]) - Primary    Reviewed labs and  CD4 trends. Will update today including hepatitis serologies to determine further vaccination needs. Quantiferon today and HLA.  Continue symtuza once daily. No further need for bactrim prophylaxis.  RTC in 70mwith labs prior to.        Other Visit Diagnoses    [place]ymptomatic HIV infection ([place]       Relevant Orders   HIV-1 RNA quant-no reflex-bld  T-helper cell (CD4)- ([facility] clinic only)      Janene Madeira, MSN, NP-C [facility] [place] Pager: [phone] Office: [phone]  05/09/20  11:09 AM

## 2020-05-09 NOTE — Assessment & Plan Note (Signed)
BP Readings from Last 3 Encounters:  05/09/20 (!) 147/99  03/21/20 (!) 156/110  12/22/19 (!) 138/103   Getting better with resuming Diovan - may need up titration. Will follow next visit.

## 2020-05-10 LAB — T-HELPER CELL (CD4) - (RCID CLINIC ONLY)
CD4 % Helper T Cell: 28 % — ABNORMAL LOW (ref 33–65)
CD4 T Cell Abs: 288 /uL — ABNORMAL LOW (ref 400–1790)

## 2020-05-15 LAB — QUANTIFERON-TB GOLD PLUS
Mitogen-NIL: >10 IU/mL
NIL: 0.03 IU/mL
QuantiFERON-TB Gold Plus: NEGATIVE
TB1-NIL: 0 IU/mL
TB2-NIL: 0 IU/mL

## 2020-05-15 LAB — HIV-1 RNA QUANT-NO REFLEX-BLD
HIV 1 RNA Quant: 31 Copies/mL — ABNORMAL HIGH
HIV-1 RNA Quant, Log: 1.49 Log cps/mL — ABNORMAL HIGH

## 2020-05-15 LAB — HEPATITIS B CORE ANTIBODY, TOTAL: Hep B Core Total Ab: NONREACTIVE

## 2020-05-15 LAB — HLA B*5701: HLA-B*5701 w/rflx HLA-B High: NEGATIVE

## 2020-05-15 LAB — HEPATITIS A ANTIBODY, TOTAL: Hepatitis A AB,Total: NONREACTIVE

## 2020-05-15 LAB — HEPATITIS B SURFACE ANTIBODY,QUALITATIVE: Hep B S Ab: REACTIVE — AB

## 2020-05-16 ENCOUNTER — Telehealth: Payer: Self-pay

## 2020-05-16 NOTE — Telephone Encounter (Signed)
-----   Message from Blanchard Kelch, NP sent at 05/16/2020  9:57 AM EST ----- Please call Debra Wallace to let her know that her immune system is recovering nicely. Her CD4 count is 288 - better than 136 6 months back. This will continue to slowly improve with more time on her medication.  Her viral load is also low enough to be considered undetectable. Keep up the good work to get every dose in every day with the Symtuza. Make sure to take it with a little food also to help it absorb best.   Thank you

## 2020-05-16 NOTE — Telephone Encounter (Signed)
Attempted to reach patient regarding lab results. Left HIPPA compliant voicemail to return call to office.  Debra Wallace

## 2020-05-19 NOTE — Telephone Encounter (Signed)
RN spoke with patient to let her know per Rexene Alberts, NP that her immune system is recovering. Relayed to patient that her CD4 count has come up from 136 to 288 and that [name] expects it to continue to come up slowly with continued time on her medication. Also advised her that her viral load is now undetectable. Instructed her that [name] would like her to keep up the good work with taking her Symtuza every day and to take it with a little food to aid in absorption. Patient very appreciative of the call and verbalized understanding with no further questions.   Sandie Ano, RN

## 2020-05-24 ENCOUNTER — Encounter: Payer: Self-pay | Admitting: Infectious Diseases

## 2020-05-27 ENCOUNTER — Other Ambulatory Visit: Payer: Self-pay | Admitting: Pharmacist

## 2020-05-27 DIAGNOSIS — I10 Essential (primary) hypertension: Secondary | ICD-10-CM

## 2020-06-05 ENCOUNTER — Telehealth: Payer: Self-pay | Admitting: *Deleted

## 2020-06-05 NOTE — Telephone Encounter (Signed)
I think she should probably stay home today and plan a rapid test today or tomorrow 2/8 if she has access to one but yes I agree with calling work to discuss sick policy for sure.   Can we offer her a curbside swab today? I don't mind going out to do that for her if we can accommodate that.  I also don't mind writing her a note out to give her for today also.   For her nose - would recommend a humidifier at home (or she can boil some water on a pot in the kitchen to get some steam in the air). She can also use a thin layer of Vaseline inside the nose a few times a day if it's not too uncomfortable to keep her nose more moistened. There is also some saline gel to use she can pick up at the pharmacy that is thinner and may be more comfortable for her.  Could be from the frequent swabbing, dry air or possibly blood pressure (she may need to increase her current medication dose from what I recall)

## 2020-06-05 NOTE — Telephone Encounter (Signed)
Patient called for advice. She had a nosebleed Saturday 2/5 at work. She was able to stop it after 40 minutes. She works in a nursing home. She has never had a nosebleed before. She denies using any nose sprays or anything going into her nose, other than weekly covid swabs for work on Saturdays.  Since Saturday, she has also developed new congestion and a cough; she feels feverish. Temperature today is 99.6.  She is fully vaccinated against Covid Debra Wallace in March, April 2021; booster January 2022 at work). Her last Covid test was 2/5 at work.   She is asking for a virtual visit today with [name] to see if she should go to work.  RN advised her to get in touch with her manager to see what their sick policy is.  If she needs to be seen by a provider to be written out of work, she can do this through an evisit in MyChart. RN sent patient text to set up MyChart.  Will send her link for evisits. Patient agreed. Andree Coss, RN

## 2020-08-05 ENCOUNTER — Other Ambulatory Visit: Payer: Self-pay | Admitting: Infectious Diseases

## 2020-08-09 ENCOUNTER — Encounter: Payer: Self-pay | Admitting: Infectious Diseases

## 2020-08-09 ENCOUNTER — Telehealth: Payer: Self-pay

## 2020-08-09 NOTE — Telephone Encounter (Addendum)
Patient informed that she does need to be evaluated. Patient verbalized understanding and stated she will go to urgent care today. She would still like a note for work to be out until Friday. Patient would like work note faxed to her job [phone] and attention Ann  Yishai Rehfeld T [name]

## 2020-08-09 NOTE — Telephone Encounter (Signed)
Aside from the finger pain I would say she probably needs to be tested for COVID since I hear so many people talk about back pain and GI illnesses related to that.   Urgent Care recommendation is perfect since I have no availability to see her today. I don't mind doing a work note for her but would like for her to get checked out.   Thank you

## 2020-08-09 NOTE — Telephone Encounter (Signed)
Patient called complaining of pain and bruising on her right pointer finger, back pain, headaches and diarrhea x 1 week. Patient states she has been using a heating pad for the back pain and drinking plenty of fluids.  Patient states she feels she is not able to go to work today and is requesting a work note.  Patient has been advised that she should go to urgent care or the ED to be evaluated. Please advise

## 2020-08-09 NOTE — Telephone Encounter (Signed)
I routed a letter to you if you don't mind printing if tor me and faxing. My Epic printer is in need of troubleshooting.  Thank you

## 2020-08-10 NOTE — Telephone Encounter (Signed)
Letter has been faxed to patient's job. Debra Wallace'

## 2020-08-15 ENCOUNTER — Encounter (HOSPITAL_COMMUNITY): Payer: Self-pay

## 2020-08-15 ENCOUNTER — Ambulatory Visit (INDEPENDENT_AMBULATORY_CARE_PROVIDER_SITE_OTHER): Payer: Self-pay

## 2020-08-15 ENCOUNTER — Ambulatory Visit (HOSPITAL_COMMUNITY)
Admission: EM | Admit: 2020-08-15 | Discharge: 2020-08-15 | Disposition: A | Discharge status: Home / Self Care | Payer: Self-pay | Attending: Student | Admitting: Student

## 2020-08-15 ENCOUNTER — Other Ambulatory Visit: Payer: Self-pay

## 2020-08-15 ENCOUNTER — Telehealth: Payer: Self-pay

## 2020-08-15 DIAGNOSIS — M545 Low back pain, unspecified: Secondary | ICD-10-CM

## 2020-08-15 DIAGNOSIS — M79641 Pain in right hand: Secondary | ICD-10-CM

## 2020-08-15 DIAGNOSIS — R2231 Localized swelling, mass and lump, right upper limb: Secondary | ICD-10-CM

## 2020-08-15 DIAGNOSIS — S39012A Strain of muscle, fascia and tendon of lower back, initial encounter: Secondary | ICD-10-CM

## 2020-08-15 DIAGNOSIS — B20 Human immunodeficiency virus [HIV] disease: Secondary | ICD-10-CM

## 2020-08-15 DIAGNOSIS — M720 Palmar fascial fibromatosis [Dupuytren]: Secondary | ICD-10-CM

## 2020-08-15 DIAGNOSIS — I10 Essential (primary) hypertension: Secondary | ICD-10-CM

## 2020-08-15 MED ORDER — PREDNISONE 20 MG PO TABS: 40.0000 mg | ORAL_TABLET | Freq: Every day | ORAL | 0 refills | Status: AC

## 2020-08-15 MED ORDER — TIZANIDINE HCL 2 MG PO CAPS: 2.0000 mg | ORAL_CAPSULE | Freq: Three times a day (TID) | ORAL | 0 refills | Status: AC

## 2020-08-15 NOTE — ED Provider Notes (Signed)
[facility]    CSN: [id number] Arrival date & time: 08/15/20  1103      History   Chief Complaint Chief Complaint  Patient presents with  . Hand Pain  . Arm Pain  . Neck Pain    HPI Emina Ribaudo is a 50 y.o. female presenting with multiple musculoskeletal complaints. Medical history HIV, hypertension.  -Notes 1 month of progressively worsening right hand pain with swelling at the base of her finger.  Denies trauma both recent and distant.  States it hurts to move her hand.  When the area is pressed on or she moves her fourth finger, pain radiates from her hand up her arm and even to her neck.  Denies sensation changes.  Denies neck pain at rest.  She is right-handed. 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  Recent exacerbation in this.  Wearing a back brace and taking Tylenol with some relief.  Denies urinary symptoms, urinary incontinence, constipation, numbness or tingling in arms or legs, weakness in arms or legs, pain radiating down to her legs -Blood pressure is typically better controlled on her daily valsartan.  Has not taken this yet today.  Denies chest pain, dizziness, shortness of breath, headaches  HPI  Past Medical History:  Diagnosis Date  . HIV (human immunodeficiency virus infection) (HCC) 11/23/2019   Dx 09/2019 CD4 nadir 136  . Hypertension     Patient Active Problem List   Diagnosis Date Noted  . Hypertension 03/21/2020  . Pruritic rash 12/22/2019  . Alcohol use 12/22/2019  . Abnormal Pap smear of cervix 11/29/2019  . Healthcare maintenance 11/29/2019  . HIV (human immunodeficiency virus infection) (HCC) 11/23/2019    Past Surgical History:  Procedure Laterality Date  . FOOT SURGERY     lt. achilles tendon repair    OB History    Gravida  2   Para  2   Term  2   Preterm      AB      Living  2     SAB      IAB      Ectopic      Multiple      Live Births  2             Home Medications    Prior to Admission medications   Medication Sig Start Date End Date Taking? Authorizing Provider  predniSONE (DELTASONE) 20 MG tablet Take 2 tablets (40 mg total) by mouth daily for 5 days. 08/15/20 08/20/20 Yes Rhys Martini, PA-C  tizanidine (ZANAFLEX) 2 MG capsule Take 1 capsule (2 mg total) by mouth 3 (three) times daily. 08/15/20  Yes Rhys Martini, PA-C  hydrOXYzine (ATARAX/VISTARIL) 25 MG tablet Take 1 tablet (25 mg total) by mouth 3 (three) times daily as needed. 05/09/20   Blanchard Kelch, NP  SYMTUZA 800-150-200-10 MG TABS TAKE 1 TABLET BY MOUTH DAILY WITH BREAKFAST 08/07/20   Blanchard Kelch, NP  valsartan (DIOVAN) 40 MG tablet TAKE 1 TABLET(40 MG) BY MOUTH DAILY 05/29/20   Blanchard Kelch, NP    Family History Family History  Problem Relation Age of Onset  . Hypertension Mother   . Diabetes Father     Social History Social History   Tobacco Use  . Smoking status: Current Every Day Smoker    Packs/day: 1.00  . Smokeless tobacco: Never Used  Substance Use Topics  . Alcohol use: Yes  Comment: occ  . Drug use: No     Allergies   Patient has no known allergies.   Review of Systems Review of Systems  Constitutional: Negative for appetite change, chills, fatigue and fever.  HENT: Negative for congestion, sinus pressure, sore throat, trouble swallowing and voice change.   Eyes: Negative for photophobia, pain, discharge, redness, itching and visual disturbance.  Respiratory: Negative for cough, chest tightness and shortness of breath.   Cardiovascular: Negative for chest pain, palpitations and leg swelling.  Gastrointestinal: Negative for abdominal pain, constipation, diarrhea, nausea and vomiting.  Genitourinary: Negative for dysuria, flank pain, frequency and urgency.  Musculoskeletal: Positive for back pain. Negative for gait problem, myalgias, neck pain and neck stiffness.       R hand pain  Neurological: Negative for  dizziness, tremors, seizures, syncope, facial asymmetry, speech difficulty, weakness, light-headedness, numbness and headaches.  Psychiatric/Behavioral: Negative for agitation, decreased concentration, dysphoric mood, hallucinations and suicidal ideas. The patient is not nervous/anxious.   All other systems reviewed and are negative.    Physical Exam Triage Vital Signs ED Triage Vitals  Enc Vitals Group     BP 08/15/20 1136 (!) 180/114     Pulse Rate 08/15/20 1136 93     Resp 08/15/20 1136 19     Temp 08/15/20 1136 97.7 F (36.5 C)     Temp src --      SpO2 08/15/20 1136 98 %     Weight --      Height --      Head Circumference --      Peak Flow --      Pain Score 08/15/20 1134 10     Pain Loc --      Pain Edu? --      Excl. in GC? --    No data found.  Updated Vital Signs BP (!) 180/114   Pulse 93   Temp 97.7 F (36.5 C)   Resp 19   LMP 02/07/2015   SpO2 98%   Visual Acuity Right Eye Distance:   Left Eye Distance:   Bilateral Distance:    Right Eye Near:   Left Eye Near:    Bilateral Near:     Physical Exam Vitals reviewed.  Constitutional:      General: She is not in acute distress.    Appearance: Normal appearance. She is not ill-appearing or diaphoretic.  HENT:     Head: Normocephalic and atraumatic.  Eyes:     Extraocular Movements: Extraocular movements intact.     Pupils: Pupils are equal, round, and reactive to light.  Cardiovascular:     Rate and Rhythm: Normal rate and regular rhythm.     Heart sounds: Normal heart sounds.  Pulmonary:     Effort: Pulmonary effort is normal.     Breath sounds: Normal breath sounds.  Abdominal:     Palpations: Abdomen is soft.     Tenderness: There is no abdominal tenderness. There is no guarding or rebound.  Musculoskeletal:     Comments: R hand- area of tenderness and swelling overlying 4th metacarpal, volar aspect. Firm and nonmobile. Exquisitely tender to palpation. ROM fingers 1-3 and 5 intact and without  pain, but ROM finger 4 is limited. Grip strength 5/5 in fingers 1-3. Cap refill <2 seconds, radial pulse 2+, neurovascularly intact. Negative phalen sign.   Exquisite tenderness along lumbar spine. Bilateral paraspinous muscle tenderness to palpation. Pain elicited with flexion lumbar spine. No bony deformity or stepoff. Sensation and  strength intact in UEs and LEs, negative straight leg raise. No cervical or thoracic spinous or paraspinous tenderness or deformity. Gait intact and without pain. Patient appears comfortable.  No other deformity, tenderness, abrasion, effusion.  Skin:    General: Skin is warm.     Capillary Refill: Capillary refill takes less than 2 seconds.  Neurological:     General: No focal deficit present.     Mental Status: She is alert and oriented to person, place, and time.  Psychiatric:        Mood and Affect: Mood normal.        Behavior: Behavior normal.        Thought Content: Thought content normal.        Judgment: Judgment normal.      UC Treatments / Results  Labs (all labs ordered are listed, but only abnormal results are displayed) Labs Reviewed - No data to display  EKG   Radiology DG Lumbar Spine Complete  Result Date: 08/15/2020 CLINICAL DATA:  Lumbar spine pain out of proportion to exam, low back pain EXAM: LUMBAR SPINE - COMPLETE 4+ VIEW COMPARISON:  None FINDINGS: Five non-rib-bearing lumbar vertebra. Vertebral body and disc space heights maintained. No fracture, subluxation, or bone destruction. No spondylolysis. SI joints preserved. IMPRESSION: No acute osseous abnormalities. Electronically Signed   By: Ulyses SouthwardMark  Boles M.D.   On: 08/15/2020 12:57   DG Hand Complete Right  Result Date: 08/15/2020 CLINICAL DATA:  Pain and swelling over fourth metacarpal for 1 month, RIGHT hand pain radiating up RIGHT arm to neck EXAM: RIGHT HAND - COMPLETE 3+ VIEW COMPARISON:  None FINDINGS: Osseous mineralization normal. Joint spaces preserved. Deformity of  distal fifth metacarpal likely old healed fracture. No acute fracture, dislocation, or bone destruction. IMPRESSION: No acute osseous abnormalities. Electronically Signed   By: Ulyses SouthwardMark  Boles M.D.   On: 08/15/2020 12:52    Procedures Procedures (including critical care time)  Medications Ordered in UC Medications - No data to display  Initial Impression / Assessment and Plan / UC Course  I have reviewed the triage vital signs and the nursing notes.  Pertinent labs & imaging results that were available during my care of the patient were reviewed by me and considered in my medical decision making (see chart for details).     This patient is a 50 year old female presenting with lumbar strain and dupuytren's contracture. Neurovascularly intact. This patient does have HIV, followed by infectious disease for this, viral load is currently detectable.  Xray R hand no acute bony abnormality. Xray lumbar spine no acute bony abnormality. Films interpreted by myself and radiologist.   Zanaflex and prednisone as below. She is not a diabetic.  I am referring this patient to ortho for further evaluation and management.  For hypertension, continue current regimen.  ED return precautions discussed. F/u with PCP as scheduled on 5/12.   Final Clinical Impressions(s) / UC Diagnoses   Final diagnoses:  Strain of lumbar region, initial encounter  HIV disease (HCC)  Essential hypertension  Dupuytren contracture     Discharge Instructions     -Start the muscle relaxer-Zanaflex (tizanidine), up to 3 times daily for muscle spasms and pain.  This can make you drowsy, so take at bedtime or when you do not need to drive or operate machinery. -Prednisone two pills taken at the same time for 5 days. Take this earlier in the day as they can give you energy. -Please check your blood pressure at home or at  the pharmacy. If this continues to be >140/90, follow-up with your primary care provider for further  blood pressure management/ medication titration. If you develop chest pain, shortness of breath, vision changes, the worst headache of your life- head straight to the ED or call 911. -Follow-up with orthopedist about your hand and back issues. Information below, or ask your PCP for referral.     ED Prescriptions    Medication Sig Dispense Auth. Provider   predniSONE (DELTASONE) 20 MG tablet Take 2 tablets (40 mg total) by mouth daily for 5 days. 10 tablet Rhys Martini, PA-C   tizanidine (ZANAFLEX) 2 MG capsule Take 1 capsule (2 mg total) by mouth 3 (three) times daily. 21 capsule Rhys Martini, PA-C     PDMP not reviewed this encounter.   Rhys Martini, PA-C 08/15/20 1340

## 2020-08-15 NOTE — Discharge Instructions (Addendum)
-  Start the muscle relaxer-Zanaflex (tizanidine), up to 3 times daily for muscle spasms and pain.  This can make you drowsy, so take at bedtime or when you do not need to drive or operate machinery. -Prednisone two pills taken at the same time for 5 days. Take this earlier in the day as they can give you energy. -Please check your blood pressure at home or at the pharmacy. If this continues to be >140/90, follow-up with your primary care provider for further blood pressure management/ medication titration. If you develop chest pain, shortness of breath, vision changes, the worst headache of your life- head straight to the ED or call 911. -Follow-up with orthopedist about your hand and back issues. Information below, or ask your PCP for referral.

## 2020-08-15 NOTE — Telephone Encounter (Signed)
Patient called to report that she is still not feeling well and is concerned it's related to her HIV. Patient requested an appointment with [name], however she is out of the office this week. Patient does not have a PCP. Offered appointment with different provider but patient wasn't comfortable seeing anyone else. Other providers are completely booked this week as well. Instructed patient to go to an urgent care for assessment and work note since [name] is out of the office and can't see her. Patient verbalized understanding and stated she would go. Thetis Schwimmer Loyola Mast, RN

## 2020-08-15 NOTE — ED Triage Notes (Signed)
Pt in with c/o right hand pain that radiates up her right arm to her neck and left arm  Pt also c/o lower back pain  Pt has been taking tylenol with no relief

## 2020-08-17 ENCOUNTER — Ambulatory Visit: Payer: Medicaid Other

## 2020-08-17 ENCOUNTER — Telehealth: Payer: Self-pay

## 2020-08-17 NOTE — Telephone Encounter (Signed)
Thank you for helping her!

## 2020-08-17 NOTE — Telephone Encounter (Signed)
Patient called stating she would like to cancel her appointment with Marylu Lund today. Patient has spoke with her father and she feels ok and does not want to been seen today. If anything changes patient will call our office back. [name] [name]

## 2020-08-17 NOTE — Telephone Encounter (Signed)
Can we get her on for a visit with me soon if she doesn't have something in the next few weeks? She needs close follow up for depression since and has called 3 times now I want to make sure we don't miss an opportunity for her.

## 2020-08-17 NOTE — Telephone Encounter (Signed)
Patient called requesting to talk to someone in regards to her feeling depressed.  Patient states she been feeling down and depressed a lot more than usual. Patient kept repeating " I am just tired". Patient denies anything new in her life that could be causing her to feel depressed. Patient denies any suicidal thoughts at this time and reports she has had suicidal thoughts in the past that come and go, but does not feel she is going to harm herself. Patient scheduled to speak with our counselor Marylu Lund today. Patient stated she has to use [employer] for transportation and if not able to get here by 2:30 she will call and request to do a phone visit with Marylu Lund today instead.  Hallelujah Wysong T [name]

## 2020-08-17 NOTE — Telephone Encounter (Signed)
Patient is scheduled for 09/07/20 with you. Do you want her in sooner?

## 2020-08-21 NOTE — Telephone Encounter (Signed)
I think that is OK but if she calls again I think we should offer an earlier appt with me if I can (30 min would be best).

## 2020-09-07 ENCOUNTER — Ambulatory Visit: Payer: Medicaid Other | Admitting: Infectious Diseases

## 2020-09-28 ENCOUNTER — Ambulatory Visit (INDEPENDENT_AMBULATORY_CARE_PROVIDER_SITE_OTHER): Payer: Self-pay | Admitting: Infectious Diseases

## 2020-09-28 ENCOUNTER — Encounter: Payer: Self-pay | Admitting: Infectious Diseases

## 2020-09-28 DIAGNOSIS — B20 Human immunodeficiency virus [HIV] disease: Secondary | ICD-10-CM

## 2020-09-28 DIAGNOSIS — U071 COVID-19: Secondary | ICD-10-CM

## 2020-09-28 MED ORDER — NIRMATRELVIR/RITONAVIR (PAXLOVID)TABLET: 3.0000 | ORAL_TABLET | Freq: Two times a day (BID) | ORAL | 0 refills | Status: AC

## 2020-09-28 NOTE — Assessment & Plan Note (Signed)
We briefly discussed Dovato today but will go into greater detail with pro/con discussion at re-scheduled visit. She is having some significant diarrhea on Symtuza so I would imagine that could improve for her.

## 2020-09-28 NOTE — Assessment & Plan Note (Signed)
COVID + and now on day 3 of symptoms. Discussed Paxlovid with her.  Her last eGFR was > 100.  Counseled over side effects and benefits of treatment.

## 2020-09-28 NOTE — Progress Notes (Signed)
Name: Debra Wallace  DOB: 14-Feb-1971 MRN: [id number] PCP: Patient, No Pcp Per (Inactive)   VIRTUAL CARE ENCOUNTER  I connected with Debra Wallace on 09/28/20 at  3:45 PM EDT by TELEPHONE and verified that I am speaking with the correct person using two identifiers.   I discussed the limitations, risks, security and privacy concerns of performing an evaluation and management service by telephone and the availability of in person appointments. I also discussed with the patient that there may be a patient responsible charge related to this service. The patient expressed understanding and agreed to proceed.  Patient Location: [place] residence   Other Participants:   Provider Location: [facility] Office    Brief Narrative:  Debra Wallace is a 50 y.o. female with HIV disease and AIDS, Dx at routine GYN care visit 08-2019 CD4 nadir 136 HIV Risk: heterosexual  History of OIs: none Intake Labs 08-2019: Hep B sAg (-), sAb (), cAb (); Hep A (), Hep C (-) Quantiferon () HLA B*5701 () G6PD: ()   Previous Regimens: . Biktarvy (worried about a rash)  . Symtuza   Genotypes: . 08-2019: QMGNOIBB   Subjective:   Chief Complaint  Patient presents with  . Telehealth Consent    Covid Positive will do phone visit.       HPI: Debra Wallace is out with COVID currently. She is not sure where she got it from (home or work). She started with sinus like symptoms 2-3 days ago. She is having a cough, fatigue and new onset diarrhea now. Overall mild illness.   She is interested in talking about Dovato - she saw a commercial about this the other night and wonders if this is better for her condition.    Review of Systems  Constitutional: Negative for chills and fever.  HENT: Positive for congestion, sinus pressure, sore throat and voice change.   Respiratory: Negative for cough.   Cardiovascular: Negative for chest pain and leg swelling.  Gastrointestinal: Positive for diarrhea. Negative for abdominal pain and  vomiting.  Genitourinary: Negative for dysuria and flank pain.  Musculoskeletal: Negative for myalgias and neck pain.  Skin: Negative for rash.  Neurological: Negative for dizziness and headaches.  Psychiatric/Behavioral: The patient is not nervous/anxious.      Past Medical History:  Diagnosis Date  . HIV (human immunodeficiency virus infection) ([place]) 11/23/2019   Dx 09/2019 CD4 nadir 136  . Hypertension     Outpatient Medications Prior to Visit  Medication Sig Dispense Refill  . hydrOXYzine (ATARAX/VISTARIL) 25 MG tablet Take 1 tablet (25 mg total) by mouth 3 (three) times daily as needed. 30 tablet 3  . SYMTUZA 800-150-200-10 MG TABS TAKE 1 TABLET BY MOUTH DAILY WITH BREAKFAST 30 tablet 5  . tizanidine (ZANAFLEX) 2 MG capsule Take 1 capsule (2 mg total) by mouth 3 (three) times daily. (Patient not taking: Reported on 09/28/2020) 21 capsule 0  . valsartan (DIOVAN) 40 MG tablet TAKE 1 TABLET(40 MG) BY MOUTH DAILY 30 tablet 5   No facility-administered medications prior to visit.     No Known Allergies  Social History   Tobacco Use  . Smoking status: Current Every Day Smoker    Packs/day: 1.00  . Smokeless tobacco: Never Used  Substance Use Topics  . Alcohol use: Yes    Comment: occ  . Drug use: No    Social History   Substance and Sexual Activity  Sexual Activity Not Currently  . Partners: Male  . Birth control/protection: Condom  Comment: declined condoms 05/09/20     Objective:   There were no vitals filed for this visit. There is no height or weight on file to calculate BMI.   Physical Exam HENT:     Nose: Congestion present.  Pulmonary:     Effort: Pulmonary effort is normal.     Comments: No shortness of breath detected in conversation.  Neurological:     Mental Status: She is oriented to person, place, and time.  Psychiatric:        Mood and Affect: Mood normal.        Behavior: Behavior normal.        Thought Content: Thought content normal.         Judgment: Judgment normal.     Lab Results Lab Results  Component Value Date   WBC 2.1 (L) 02/22/2020   HGB 13.6 02/22/2020   HCT 39.6 02/22/2020   MCV 99.5 02/22/2020   PLT 233 02/22/2020    Lab Results  Component Value Date   CREATININE 0.63 02/22/2020   BUN 7 02/22/2020   NA 133 (L) 02/22/2020   K 4.2 02/22/2020   CL 99 02/22/2020   CO2 23 02/22/2020    Lab Results  Component Value Date   ALT 20 02/22/2020   AST 58 (H) 02/22/2020   ALKPHOS 279 (H) 09/22/2019   BILITOT 0.6 02/22/2020    No results found for: CHOL, HDL, LDLCALC, LDLDIRECT, TRIG, CHOLHDL HIV 1 RNA Quant (Copies/mL)  Date Value  05/09/2020 31 (H)  02/22/2020 69 (H)  12/22/2019 26,900 (H)   CD4 T Cell Abs (/uL)  Date Value  05/09/2020 288 (L)  02/22/2020 249 (L)  12/22/2019 249 (L)     Assessment & Plan:   Problem List Items Addressed This Visit      Unprioritized   HIV (human immunodeficiency virus infection) ([place])    We briefly discussed Dovato today but will go into greater detail with pro/con discussion at re-scheduled visit. She is having some significant diarrhea on Symtuza so I would imagine that could improve for her.       Relevant Medications   nirmatrelvir/ritonavir EUA (PAXLOVID) TABS   COVID-19 virus infection - Primary    COVID + and now on day 3 of symptoms. Discussed Paxlovid with her.  Her last eGFR was > 100.  Counseled over side effects and benefits of treatment.       Relevant Medications   nirmatrelvir/ritonavir EUA (PAXLOVID) TABS      Follow Up Instructions: paxlovid full dose x 5d Isolation precautions with consideration for return to work day 6 from symptom onset with full mask over nose/mouth  Rescheduled her regular care visit with me for 3 weeks.    I discussed the assessment and treatment plan with the patient. The patient was provided an opportunity to ask questions and all were answered. The patient agreed with the plan and demonstrated an  understanding of the instructions.   The patient was advised to call back or seek an in-person evaluation if the symptoms worsen or if the condition fails to improve as anticipated.  I provided 14 minutes of non-face-to-face time during this encounter.   Janene Madeira, MSN, NP-C [facility] for Infectious Disease [email or web address] Pager: [phone] Office: [phone] [facility] Main Line: [place], MSN, NP-C [place] for Infectious [place] Pager: [phone] Office: [phone]  09/28/20  5:48 PM

## 2020-10-10 ENCOUNTER — Other Ambulatory Visit: Payer: Self-pay | Admitting: Infectious Diseases

## 2020-10-10 DIAGNOSIS — U071 COVID-19: Secondary | ICD-10-CM

## 2020-10-10 DIAGNOSIS — B20 Human immunodeficiency virus [HIV] disease: Secondary | ICD-10-CM

## 2020-10-26 ENCOUNTER — Ambulatory Visit: Payer: Medicaid Other | Admitting: Infectious Diseases

## 2020-11-01 ENCOUNTER — Ambulatory Visit: Payer: Medicaid Other

## 2020-11-01 ENCOUNTER — Ambulatory Visit (INDEPENDENT_AMBULATORY_CARE_PROVIDER_SITE_OTHER): Payer: Medicaid Other

## 2020-11-01 ENCOUNTER — Other Ambulatory Visit (HOSPITAL_COMMUNITY): Payer: Self-pay

## 2020-11-01 ENCOUNTER — Other Ambulatory Visit: Payer: Self-pay

## 2020-11-01 ENCOUNTER — Encounter: Payer: Self-pay | Admitting: Infectious Diseases

## 2020-11-01 ENCOUNTER — Telehealth: Payer: Self-pay

## 2020-11-01 ENCOUNTER — Ambulatory Visit (INDEPENDENT_AMBULATORY_CARE_PROVIDER_SITE_OTHER): Payer: Self-pay | Admitting: Infectious Diseases

## 2020-11-01 VITALS — BP 150/98 | HR 90 | Temp 97.6°F | Wt 151.0 lb

## 2020-11-01 DIAGNOSIS — Z21 Asymptomatic human immunodeficiency virus [HIV] infection status: Secondary | ICD-10-CM

## 2020-11-01 DIAGNOSIS — Z789 Other specified health status: Secondary | ICD-10-CM

## 2020-11-01 DIAGNOSIS — Z7289 Other problems related to lifestyle: Secondary | ICD-10-CM

## 2020-11-01 DIAGNOSIS — I10 Essential (primary) hypertension: Secondary | ICD-10-CM

## 2020-11-01 DIAGNOSIS — B20 Human immunodeficiency virus [HIV] disease: Secondary | ICD-10-CM

## 2020-11-01 DIAGNOSIS — L309 Dermatitis, unspecified: Secondary | ICD-10-CM

## 2020-11-01 DIAGNOSIS — Z23 Encounter for immunization: Secondary | ICD-10-CM

## 2020-11-01 DIAGNOSIS — M720 Palmar fascial fibromatosis [Dupuytren]: Secondary | ICD-10-CM

## 2020-11-01 MED ORDER — TRIAMCINOLONE ACETONIDE 0.5 % EX OINT: 1.0000 "application " | TOPICAL_OINTMENT | Freq: Two times a day (BID) | CUTANEOUS | 3 refills | Status: AC

## 2020-11-01 MED ORDER — HYDROXYZINE HCL 25 MG PO TABS: 25.0000 mg | ORAL_TABLET | Freq: Three times a day (TID) | ORAL | 3 refills | Status: AC | PRN

## 2020-11-01 MED ORDER — SYMTUZA 800-150-200-10 MG PO TABS: 1.0000 | ORAL_TABLET | Freq: Every day | ORAL | 5 refills | Status: AC

## 2020-11-01 NOTE — Assessment & Plan Note (Signed)
Much improved with more reasonable consumption daily.  We will check her LFTs today.  Could be exacerbating her Dupuytren contracture.

## 2020-11-01 NOTE — Assessment & Plan Note (Signed)
BP Readings from Last 3 Encounters:  11/01/20 (!) 150/98  08/15/20 (!) 180/114  05/09/20 (!) 147/99   Better controlled with Diovan - will continue. She has access to insurance in August and will help arrange her to be seen in Internal Medicine clinic for ongoing care related to HTN and preventative medicine.

## 2020-11-01 NOTE — Assessment & Plan Note (Signed)
She would like to continue her Symtuza.  We will update pertinent labs today.  I gave her a 30-day sample for Symtuza as she is having trouble with the fill from her pharmacy.  We will need to get her set up with co-pay assistance next month when insurance kicks in.  I told her to please call us before she pays any co-pay to her medicine.  Her immune system showed signs of recovery with last CD4 288.  We gave her her COVID booster today.  This will make her up-to-date per current recommendations. Return in about 4 months (around 03/04/2021).

## 2020-11-01 NOTE — Patient Instructions (Addendum)
Continue your Symtuza once daily like you have been.   We gave you your COVID booster today - you are all caught up on these vaccines.   Will send in a higher dose steroid cream for you to use on your eczema  Use the GoodRx card to pick it up.   Would use it 1-2 times a day for a week when these flare and itch.  Will refill your hydroxyzine also

## 2020-11-01 NOTE — Assessment & Plan Note (Addendum)
Ongoing swelling with flexion of the ring finger. Still som palmar swelling observed and trigger finger. Will help with referral to hand orthopedic team for evaluation / management when she has insurance available. For now continue PRN Tylenol for pain control.

## 2020-11-01 NOTE — Telephone Encounter (Signed)
Medication Samples have been provided to the patient.  Drug name: Symtuza        Strength: 800/150/200/10 mg Qty: 30  LOT: [id number]   Exp.Date: 03/23  Dosing instructions: Take one tablet by mouth once daily with food  Clearance Coots, CPhT Specialty Pharmacy Patient [facility] for Infectious Disease Phone: [phone] Fax:  [phone]

## 2020-11-01 NOTE — Assessment & Plan Note (Signed)
Overall skin condition is improved as her immune system has recovered.  We will refill hydroxyzine for as needed use.  Topical triamcinolone steroid cream twice daily for a week during flares.  Liberalized application of lotion as she has been doing.

## 2020-11-01 NOTE — Progress Notes (Signed)
   Covid-19 Vaccination Clinic  Name:  Debra Wallace    MRN: [id number] DOB: November 02, 1970  11/01/2020  Debra Wallace was observed post Covid-19 immunization for 15 minutes without incident. She was provided with Vaccine Information Sheet and instruction to access the V-Safe system.   Debra Wallace was instructed to call 911 with any severe reactions post vaccine: Difficulty breathing  Swelling of face and throat  A fast heartbeat  A bad rash all over body  Dizziness and weakness   Immunizations Administered     Name Date Dose VIS Date Route   PFIZER Comrnaty(Gray TOP) Covid-19 Vaccine 11/01/2020  4:23 PM 0.3 mL 04/06/2020 Intramuscular   Manufacturer: [employer], [employer]   Lot: [id number]   NDC: [phone]        [place]o Trine Lesli Albee, CMA

## 2020-11-01 NOTE — Progress Notes (Signed)
Name: Debra Wallace  DOB: Dec 31, 1970 MRN: [id number] PCP: Patient, No Pcp Per (Inactive)     Brief Narrative:  Debra Wallace is a 50 y.o. female with HIV disease and AIDS, Dx at routine GYN care visit 08-2019 CD4 nadir 136 HIV Risk: heterosexual  History of OIs: none Intake Labs 08-2019: Hep B sAg (-), sAb (), cAb (); Hep A (), Hep C (-) Quantiferon () HLA B*5701 () G6PD: ()   Previous Regimens: Biktarvy (worried about a rash)  Symtuza   Genotypes: 08-2019: Wildtype   Subjective:   Chief Complaint  Patient presents with   Follow-up    B20       HPI: Eddie Dibbles is here for routine follow-up care.  Last we spoke over the video visit few months ago she was recovering from COVID infection.  Since that time she has done well and completely recovered with no lingering symptoms.  She would like her booster shot today if possible.  Continues to take her Symtuza once a day.  She was considering switching to Dovato but would prefer to discontinue the Symtuza since she has been tolerating it well without any adverse effect.  She has not missed any medication.  She is working a lot and drinking less alcohol.  Usually 112 ounce beer a day but occasionally will have a 40 ounce beer.  She feels like she is regained some weight and she is pleased about that and feels like it is a sign that she is getting healthy.  She has several patches of eczema over her arms that she has been treating with over-the-counter eczema lotion, prescribed antihistamines and hydrocortisone cream.  They still itch and bother her periodically.  Has insurance coming available to her in August of this year. Still has a lot of pain in the right hand with associated trigger finger and Duptyen's contracture. Has improved a little but not resolved.     Review of Systems  Constitutional:  Negative for appetite change, chills, fatigue, fever and unexpected weight change.  HENT:  Negative for mouth sores, sore throat and  trouble swallowing.   Eyes:  Negative for pain and visual disturbance.  Respiratory:  Negative for cough and shortness of breath.   Cardiovascular:  Negative for chest pain.  Gastrointestinal:  Negative for abdominal pain, diarrhea and nausea.  Genitourinary:  Negative for dysuria, menstrual problem and pelvic pain.  Musculoskeletal:  Negative for back pain and neck pain.  Skin:  Positive for color change and rash.  Neurological:  Negative for weakness, numbness and headaches.  Hematological:  Negative for adenopathy.  Psychiatric/Behavioral:  Negative for dysphoric mood. The patient is not nervous/anxious.     Past Medical History:  Diagnosis Date   HIV (human immunodeficiency virus infection) ([place]) 11/23/2019   Dx 09/2019 CD4 nadir 136   Hypertension     Outpatient Medications Prior to Visit  Medication Sig Dispense Refill   valsartan (DIOVAN) 40 MG tablet TAKE 1 TABLET(40 MG) BY MOUTH DAILY 30 tablet 5   hydrOXYzine (ATARAX/VISTARIL) 25 MG tablet Take 1 tablet (25 mg total) by mouth 3 (three) times daily as needed. 30 tablet 3   SYMTUZA 800-150-200-10 MG TABS TAKE 1 TABLET BY MOUTH DAILY WITH BREAKFAST 30 tablet 5   tizanidine (ZANAFLEX) 2 MG capsule Take 1 capsule (2 mg total) by mouth 3 (three) times daily. (Patient not taking: Reported on 11/01/2020) 21 capsule 0   No facility-administered medications prior to visit.     No Known Allergies  Social History   Tobacco Use   Smoking status: Every Day    Packs/day: 1.00    Pack years: 0.00    Types: Cigarettes   Smokeless tobacco: Never  Substance Use Topics   Alcohol use: Yes    Comment: occ   Drug use: Yes    Types: Marijuana    Social History   Substance and Sexual Activity  Sexual Activity Not Currently   Partners: Male   Birth control/protection: Condom   Comment: declined condoms 7.6.2022     Objective:   Vitals:   11/01/20 1528  BP: (!) 150/98  Pulse: 90  Temp: 97.6 F (36.4 C)  TempSrc: Oral   Weight: 151 lb (68.5 kg)   Body mass index is 25.13 kg/m.   Physical Exam Vitals reviewed.  HENT:     Mouth/Throat:     Mouth: No oral lesions.     Dentition: No dental abscesses.  Cardiovascular:     Rate and Rhythm: Normal rate and regular rhythm.     Heart sounds: Normal heart sounds.  Pulmonary:     Effort: Pulmonary effort is normal.     Breath sounds: Normal breath sounds.  Abdominal:     General: There is no distension.     Palpations: Abdomen is soft.     Tenderness: There is no abdominal tenderness.  Musculoskeletal:        General: No tenderness. Normal range of motion.     Comments: Swelling noted to the right palmar ring finger with associated trigger finger.   Lymphadenopathy:     Cervical: No cervical adenopathy.  Skin:    General: Skin is warm and dry.     Findings: No rash.     Comments: Annular hyperpigmented maculopapular lesions noted on arms.  Neurological:     General: No focal deficit present.     Mental Status: She is alert and oriented to person, place, and time.  Psychiatric:        Judgment: Judgment normal.    Lab Results Lab Results  Component Value Date   WBC 2.1 (L) 02/22/2020   HGB 13.6 02/22/2020   HCT 39.6 02/22/2020   MCV 99.5 02/22/2020   PLT 233 02/22/2020    Lab Results  Component Value Date   CREATININE 0.63 02/22/2020   BUN 7 02/22/2020   NA 133 (L) 02/22/2020   K 4.2 02/22/2020   CL 99 02/22/2020   CO2 23 02/22/2020    Lab Results  Component Value Date   ALT 20 02/22/2020   AST 58 (H) 02/22/2020   ALKPHOS 279 (H) 09/22/2019   BILITOT 0.6 02/22/2020    No results found for: CHOL, HDL, LDLCALC, LDLDIRECT, TRIG, CHOLHDL HIV 1 RNA Quant (Copies/mL)  Date Value  05/09/2020 31 (H)  02/22/2020 69 (H)  12/22/2019 26,900 (H)   CD4 T Cell Abs (/uL)  Date Value  05/09/2020 288 (L)  02/22/2020 249 (L)  12/22/2019 249 (L)     Assessment & Plan:   Problem List Items Addressed This Visit       Unprioritized    Hypertension    BP Readings from Last 3 Encounters:  11/01/20 (!) 150/98  08/15/20 (!) 180/114  05/09/20 (!) 147/99  Better controlled with Diovan - will continue. She has access to insurance in August and will help arrange her to be seen in Internal Medicine clinic for ongoing care related to HTN and preventative medicine.        HIV (human  immunodeficiency virus infection) ([place])    She would like to continue her Symtuza.  We will update pertinent labs today.  I gave her a 30-day sample for Symtuza as she is having trouble with the fill from her pharmacy.  We will need to get her set up with co-pay assistance next month when insurance kicks in.  I told her to please call us before she pays any co-pay to her medicine.  Her immune system showed signs of recovery with last CD4 288.  We gave her her COVID booster today.  This will make her up-to-date per current recommendations. Return in about 4 months (around 03/04/2021).        Relevant Medications   Darunavir-Cobicisctat-Emtricitabine-Tenofovir Alafenamide (SYMTUZA) 800-150-200-10 MG TABS   Eczema    Overall skin condition is improved as her immune system has recovered.  We will refill hydroxyzine for as needed use.  Topical triamcinolone steroid cream twice daily for a week during flares.  Liberalized application of lotion as she has been doing.       Dupuytren's contracture of right hand    Ongoing swelling with flexion of the ring finger. Still som palmar swelling observed and trigger finger. Will help with referral to hand orthopedic team for evaluation / management when she has insurance available. For now continue PRN Tylenol for pain control.        Alcohol use    Much improved with more reasonable consumption daily.  We will check her LFTs today.  Could be exacerbating her Dupuytren contracture.        Other Visit Diagnoses     Asymptomatic HIV infection ([place])    -  Primary   Relevant Medications    Darunavir-Cobicisctat-Emtricitabine-Tenofovir Alafenamide (SYMTUZA) 800-150-200-10 MG TABS   Other Relevant Orders   HIV-1 RNA quant-no reflex-bld   COMPLETE METABOLIC PANEL WITH GFR   CBC with Differential/Platelet   T-helper cell (CD4)- ([facility] clinic only)        Janene Madeira, MSN, NP-C [facility] [email or web address] Pager: [phone] Office: [place]: [phone]    11/01/20  4:44 PM

## 2020-11-02 LAB — T-HELPER CELL (CD4) - (RCID CLINIC ONLY)
CD4 % Helper T Cell: 28 % — ABNORMAL LOW (ref 33–65)
CD4 T Cell Abs: 290 /uL — ABNORMAL LOW (ref 400–1790)

## 2020-11-03 LAB — COMPLETE METABOLIC PANEL WITH GFR
AG Ratio: 1.1 (calc) (ref 1.0–2.5)
ALT: 28 U/L (ref 6–29)
AST: 80 U/L — ABNORMAL HIGH (ref 10–35)
Albumin: 4.3 g/dL (ref 3.6–5.1)
Alkaline phosphatase (APISO): 106 U/L (ref 31–125)
BUN/Creatinine Ratio: 8 (calc) (ref 6–22)
BUN: 6 mg/dL — ABNORMAL LOW (ref 7–25)
CO2: 18 mmol/L — ABNORMAL LOW (ref 20–32)
Calcium: 9 mg/dL (ref 8.6–10.2)
Chloride: 91 mmol/L — ABNORMAL LOW (ref 98–110)
Creat: 0.71 mg/dL (ref 0.50–1.10)
GFR, Est African American: 116 mL/min/{1.73_m2} (ref >=60)
GFR, Est Non African American: 100 mL/min/{1.73_m2} (ref >=60)
Globulin: 4 g/dL (calc) — ABNORMAL HIGH (ref 1.9–3.7)
Glucose, Bld: 79 mg/dL (ref 65–99)
Potassium: 4.1 mmol/L (ref 3.5–5.3)
Sodium: 123 mmol/L — ABNORMAL LOW (ref 135–146)
Total Bilirubin: 0.6 mg/dL (ref 0.2–1.2)
Total Protein: 8.3 g/dL — ABNORMAL HIGH (ref 6.1–8.1)

## 2020-11-03 LAB — CBC WITH DIFFERENTIAL/PLATELET
Absolute Monocytes: 315 cells/uL (ref 200–950)
Basophils Absolute: 10 cells/uL (ref 0–200)
Basophils Relative: 0.4 %
Eosinophils Absolute: 50 cells/uL (ref 15–500)
Eosinophils Relative: 2 %
HCT: 33.8 % — ABNORMAL LOW (ref 35.0–45.0)
Hemoglobin: 12.1 g/dL (ref 11.7–15.5)
Lymphs Abs: 1083 cells/uL (ref 850–3900)
MCH: 33.2 pg — ABNORMAL HIGH (ref 27.0–33.0)
MCHC: 35.8 g/dL (ref 32.0–36.0)
MCV: 92.9 fL (ref 80.0–100.0)
MPV: 8.3 fL (ref 7.5–12.5)
Monocytes Relative: 12.6 %
Neutro Abs: 1043 cells/uL — ABNORMAL LOW (ref 1500–7800)
Neutrophils Relative %: 41.7 %
Platelets: 191 10*3/uL (ref 140–400)
RBC: 3.64 10*6/uL — ABNORMAL LOW (ref 3.80–5.10)
RDW: 12.9 % (ref 11.0–15.0)
Total Lymphocyte: 43.3 %
WBC: 2.5 10*3/uL — ABNORMAL LOW (ref 3.8–10.8)

## 2020-11-03 LAB — HIV-1 RNA QUANT-NO REFLEX-BLD
HIV 1 RNA Quant: NOT DETECTED Copies/mL
HIV-1 RNA Quant, Log: NOT DETECTED Log cps/mL

## 2020-11-16 ENCOUNTER — Telehealth: Payer: Self-pay

## 2020-11-16 NOTE — Telephone Encounter (Signed)
Patient called wanting to talk to [facility], I did inform her she was out of the office this week. Left message concerning medication, wants to know if she can get a higher   dose of blood pressure medicine. Has had frequent headaches and some dizziness at work on 7/20, also mentioned she does get tested every week and it not positive for covid. Patient checked her blood pressure and was 151/101, which does run high most of the time but is still concern. Best contact number is [phone]

## 2020-11-16 NOTE — Telephone Encounter (Signed)
FYI: Called patient to follow up on BP concerns. States that her BP has been elevated even after starting Diovan 40 mg. Would like to discuss getting hight dose on BP medication. Does not have a PCP listed in chart. Patient is not able to come into office today for evaluation. Scheduled to see Dr. Renold Don 7/22.

## 2020-11-17 ENCOUNTER — Ambulatory Visit: Payer: Medicaid Other | Admitting: Internal Medicine

## 2020-11-20 ENCOUNTER — Ambulatory Visit: Payer: Medicaid Other | Admitting: Family Medicine

## 2020-11-22 ENCOUNTER — Telehealth: Payer: Self-pay

## 2020-11-22 ENCOUNTER — Ambulatory Visit: Payer: Medicaid Other

## 2020-11-22 NOTE — Telephone Encounter (Signed)
Received call from [place] with EMS, he states they responded to a call at the patient's residence where she was found deceased.   He says he was directed to call our office as patient had no PCP. He is asking if she has any "specific infectious disease that needs to be reported." RN confirmed that patient was a patient of Stephanie's, but did not disclose any of the patient's health information.   He is asking that Rexene Alberts, NP be the provider to sign the death certificate. Will route to provider.    Sandie Ano, RN

## 2020-11-23 NOTE — Telephone Encounter (Signed)
Spoke with Caryn Bee at [facility], he states our office will need to let the funeral home know that death certificate should be deferred to medical examiner.   Sandie Ano, RN

## 2020-11-29 ENCOUNTER — Ambulatory Visit: Payer: Medicaid Other | Admitting: Family Medicine

## 2020-11-29 NOTE — Telephone Encounter (Signed)
Received call from [facility] [facility] requesting that Rexene Alberts, NP sign death certificate. Kathlee Nations that provider is unable to sign and requested that she defer to the medical examiner.   Sandie Ano, RN

## 2020-12-04 NOTE — Telephone Encounter (Signed)
Received call today from Marylene Land at [facility] regarding death certificate. Stated that medical examiner is not able to sign off on death certificate due to underline diseases that office was managing.  Relayed message from provider that she is not able to sign off due to not having a expected cause of death. Medical examine will be calling office to discuss this form. Juanita Laster, RMA

## 2020-12-06 ENCOUNTER — Telehealth: Payer: Self-pay | Admitting: Infectious Diseases

## 2020-12-06 NOTE — Telephone Encounter (Signed)
Awaiting access in [place] [facility] to certify death certificate. Unable to locate decedent in online site. Awaiting call back from [facility] to help provide assistance and how to proceed.

## 2021-02-23 ENCOUNTER — Ambulatory Visit: Payer: Medicaid Other | Admitting: Infectious Diseases

## 2021-03-13 ENCOUNTER — Ambulatory Visit: Payer: Medicaid Other | Admitting: Infectious Diseases

## 2021-03-13 ENCOUNTER — Ambulatory Visit: Payer: Medicaid Other

## 2021-05-22 IMAGING — DX DG HAND COMPLETE 3+V*R*
3 series · 3 of 3 positions shown · non-contrast
Comparison: None

CLINICAL DATA: Pain and swelling over fourth metacarpal for 1
month, RIGHT hand pain radiating up RIGHT arm to neck

EXAM:
RIGHT HAND - COMPLETE 3+ VIEW

[hand pa]
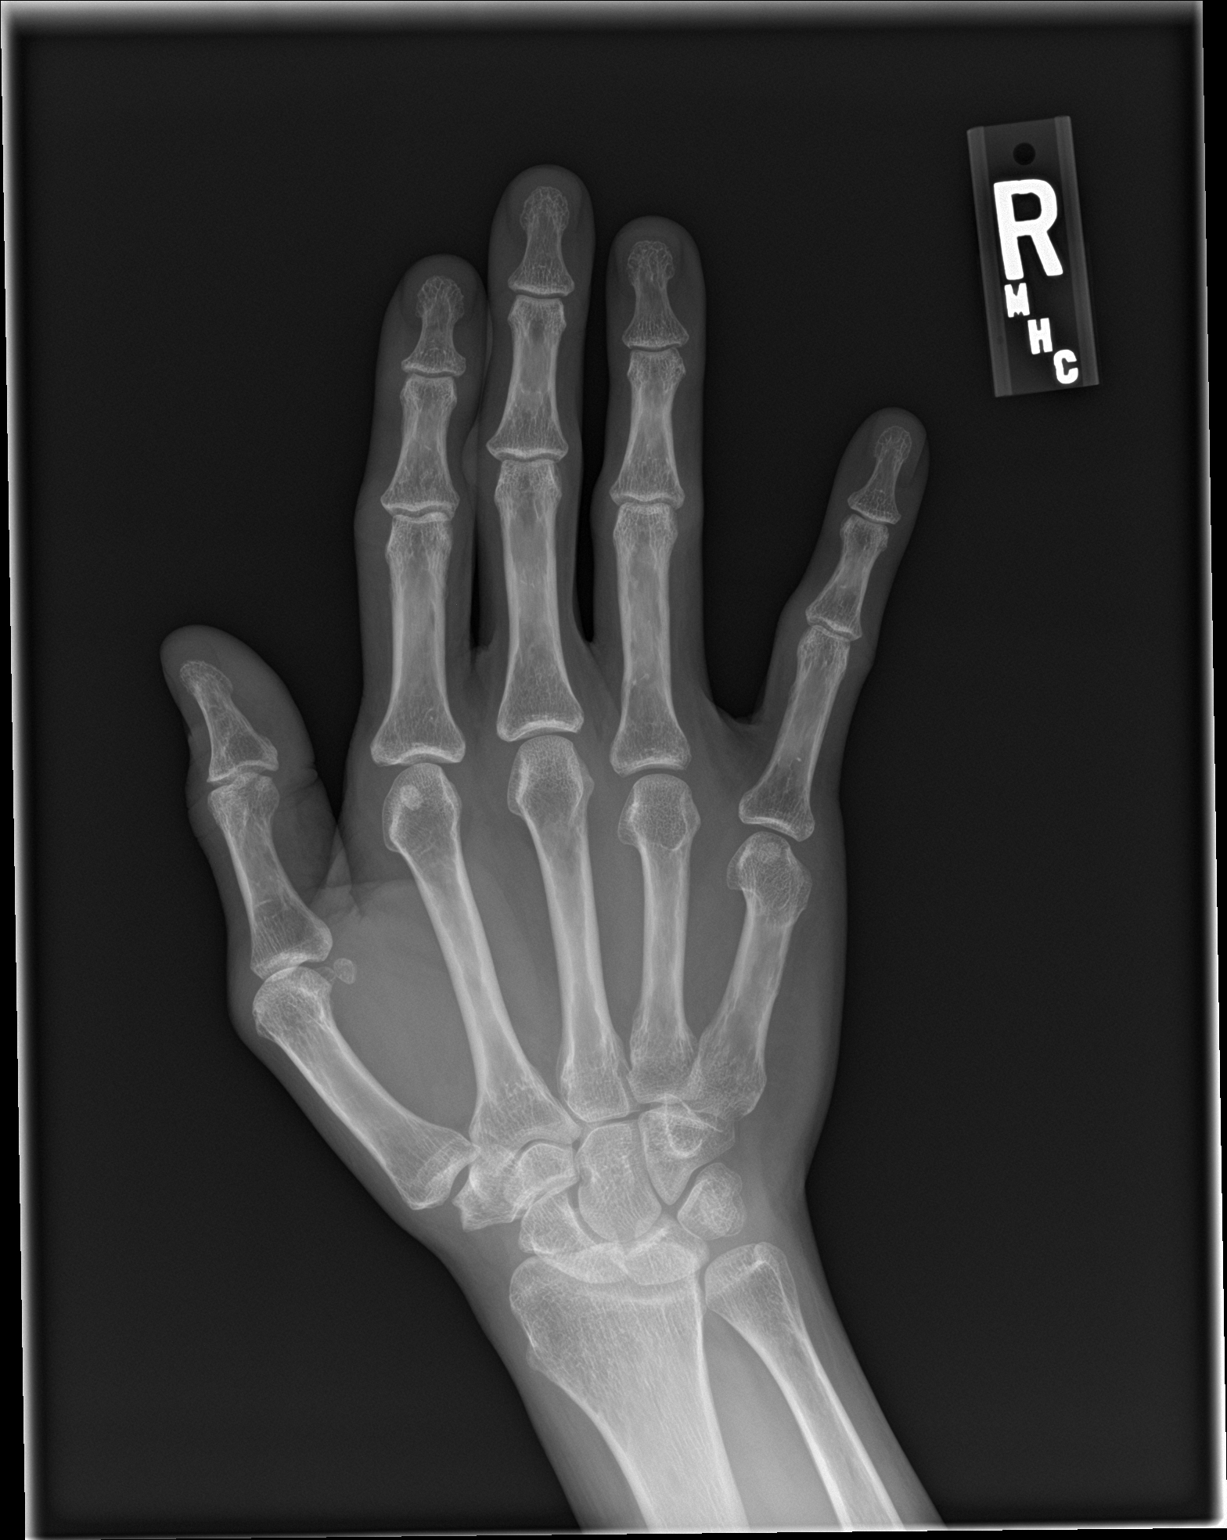

[hand obl]
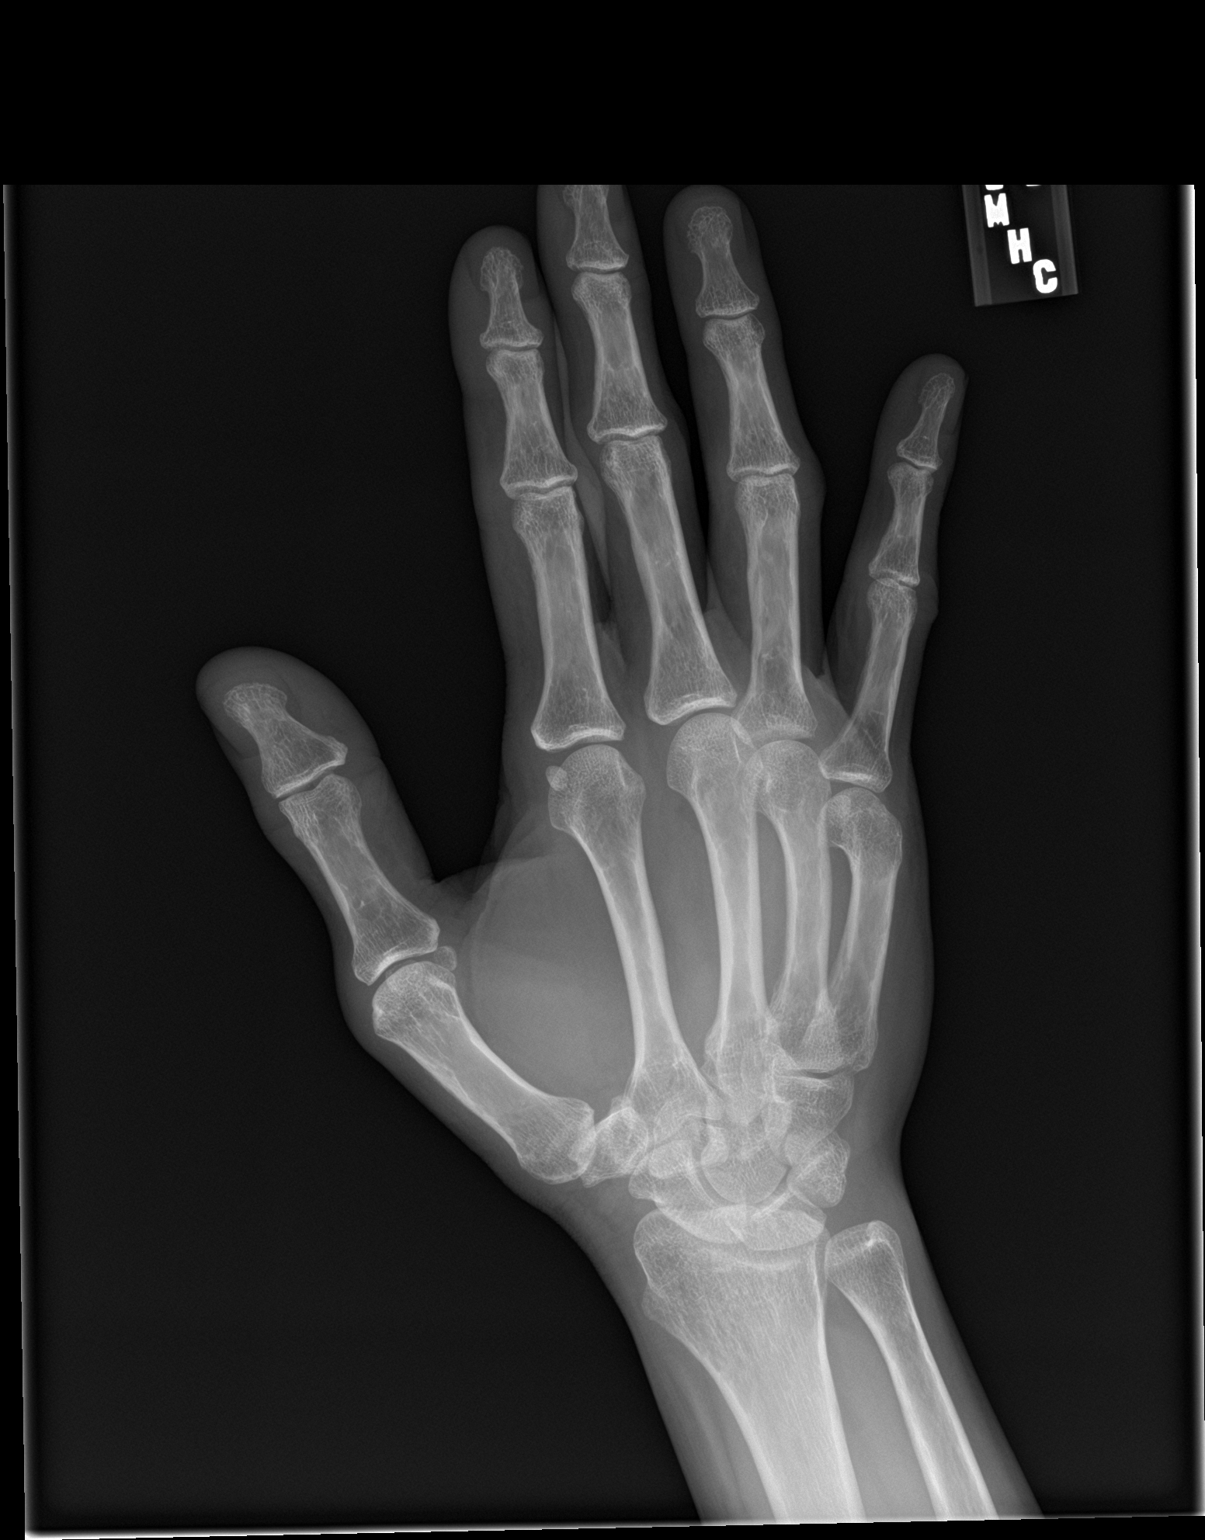

[hand lat]
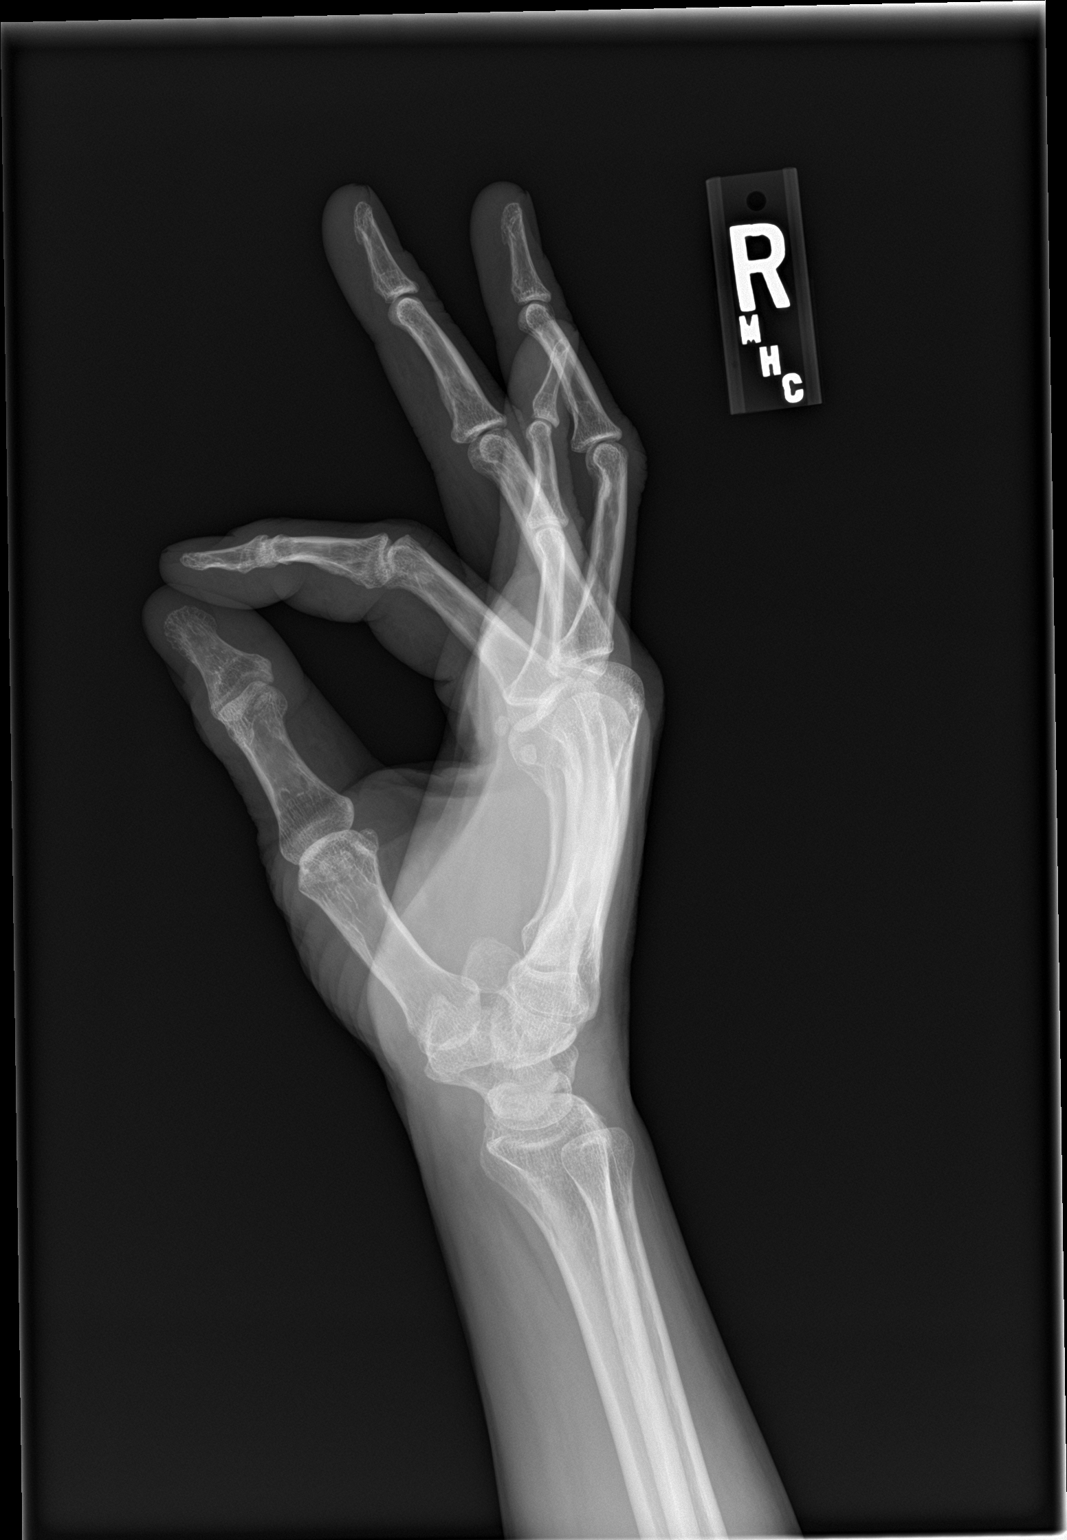

[3 of 3 positions shown; findings below may reference images not displayed]

FINDINGS: Osseous mineralization normal.

Joint spaces preserved.

Deformity of distal fifth metacarpal likely old healed fracture.

No acute fracture, dislocation, or bone destruction.
IMPRESSION: No acute osseous abnormalities.
